# Patient Record
Sex: Female | Born: 1935 | Race: White | Hispanic: No | Smoking: Former smoker
Health system: Southern US, Community
[De-identification: ages and names within clinical notes are randomized; demographics above are authoritative.]

## PROBLEM LIST (undated history)

## (undated) DIAGNOSIS — I350 Nonrheumatic aortic (valve) stenosis: Secondary | ICD-10-CM

## (undated) DIAGNOSIS — K219 Gastro-esophageal reflux disease without esophagitis: Secondary | ICD-10-CM

## (undated) DIAGNOSIS — E7800 Pure hypercholesterolemia, unspecified: Secondary | ICD-10-CM

## (undated) DIAGNOSIS — C801 Malignant (primary) neoplasm, unspecified: Secondary | ICD-10-CM

## (undated) DIAGNOSIS — I10 Essential (primary) hypertension: Secondary | ICD-10-CM

## (undated) DIAGNOSIS — I509 Heart failure, unspecified: Secondary | ICD-10-CM

## (undated) DIAGNOSIS — I4891 Unspecified atrial fibrillation: Secondary | ICD-10-CM

## (undated) HISTORY — PX: CARDIAC CATHETERIZATION: SHX172

## (undated) HISTORY — PX: ABDOMINAL HYSTERECTOMY: SHX81

## (undated) HISTORY — PX: BREAST SURGERY: SHX581

---

## 2003-04-30 ENCOUNTER — Other Ambulatory Visit: Payer: Self-pay

## 2004-03-13 ENCOUNTER — Emergency Department: Payer: Self-pay | Admitting: Emergency Medicine

## 2004-03-13 ENCOUNTER — Other Ambulatory Visit: Payer: Self-pay

## 2004-04-25 ENCOUNTER — Ambulatory Visit: Payer: Self-pay | Admitting: Oncology

## 2004-06-13 ENCOUNTER — Emergency Department: Payer: Self-pay | Admitting: Emergency Medicine

## 2004-08-17 ENCOUNTER — Ambulatory Visit: Payer: Self-pay | Admitting: Internal Medicine

## 2004-09-02 ENCOUNTER — Emergency Department: Payer: Self-pay | Admitting: Emergency Medicine

## 2005-12-31 ENCOUNTER — Ambulatory Visit: Payer: Self-pay | Admitting: Internal Medicine

## 2006-04-16 ENCOUNTER — Ambulatory Visit: Payer: Self-pay | Admitting: Unknown Physician Specialty

## 2007-01-09 ENCOUNTER — Ambulatory Visit: Payer: Self-pay | Admitting: Internal Medicine

## 2007-05-19 ENCOUNTER — Ambulatory Visit: Payer: Self-pay | Admitting: Ophthalmology

## 2007-05-19 ENCOUNTER — Other Ambulatory Visit: Payer: Self-pay

## 2007-05-28 ENCOUNTER — Ambulatory Visit: Payer: Self-pay | Admitting: Ophthalmology

## 2008-01-12 ENCOUNTER — Ambulatory Visit: Payer: Self-pay | Admitting: Internal Medicine

## 2009-01-24 ENCOUNTER — Inpatient Hospital Stay: Payer: Self-pay | Admitting: Internal Medicine

## 2009-04-19 ENCOUNTER — Ambulatory Visit: Payer: Self-pay | Admitting: Internal Medicine

## 2009-10-06 ENCOUNTER — Inpatient Hospital Stay: Payer: Self-pay | Admitting: Internal Medicine

## 2009-10-12 ENCOUNTER — Inpatient Hospital Stay: Payer: Self-pay | Admitting: Internal Medicine

## 2010-03-14 ENCOUNTER — Ambulatory Visit: Payer: Self-pay | Admitting: *Deleted

## 2010-03-30 ENCOUNTER — Observation Stay: Payer: Self-pay | Admitting: Family Medicine

## 2010-05-09 ENCOUNTER — Inpatient Hospital Stay: Payer: Self-pay | Admitting: Internal Medicine

## 2010-05-31 ENCOUNTER — Ambulatory Visit: Payer: Self-pay | Admitting: Internal Medicine

## 2010-06-15 NOTE — Assessment & Plan Note (Signed)
Summary: FLU SHOT/EVM  Nurse Visit   Immunizations Administered:  Influenza Vaccine:    Vaccine Type: FLULAVAL    Site: left deltoid    Mfr: GlaxoSmithKline    Dose: 0.5 ml    Route: IM    Given by: Levonne Spiller EMT-P    Exp. Date: 11/11/2010    Lot #: [id number]    VIS given: 12/06/09 version given March 14, 2010.   Immunizations Administered:  Influenza Vaccine:    Vaccine Type: FLULAVAL    Site: left deltoid    Mfr: GlaxoSmithKline    Dose: 0.5 ml    Route: IM    Given by: Levonne Spiller EMT-P    Exp. Date: 11/11/2010    Lot #: [id number]    VIS given: 12/06/09 version given March 14, 2010.  Flu Vaccine Consent Questions:    Do you have a history of severe allergic reactions to this vaccine? no    Any prior history of allergic reactions to egg and/or gelatin? no    Do you have a sensitivity to the preservative Thimersol? no    Do you have a past history of Guillan-Barre Syndrome? no    Do you currently have an acute febrile illness? no    Have you ever had a severe reaction to latex? no    Vaccine information given and explained to patient? yes    Are you currently pregnant? no

## 2010-09-24 ENCOUNTER — Inpatient Hospital Stay: Payer: Self-pay | Admitting: Internal Medicine

## 2011-12-14 LAB — CBC
HGB: 15.5 g/dL (ref 12.0–16.0)
MCH: 31.4 pg (ref 26.0–34.0)
MCV: 93 fL (ref 80–100)
Platelet: 182 10*3/uL (ref 150–440)
RBC: 4.94 10*6/uL (ref 3.80–5.20)
WBC: 5.9 10*3/uL (ref 3.6–11.0)

## 2011-12-14 LAB — BASIC METABOLIC PANEL
BUN: 13 mg/dL (ref 7–18)
Calcium, Total: 9.6 mg/dL (ref 8.5–10.1)
Chloride: 102 mmol/L (ref 98–107)
Co2: 27 mmol/L (ref 21–32)
Potassium: 4 mmol/L (ref 3.5–5.1)
Sodium: 138 mmol/L (ref 136–145)

## 2011-12-14 LAB — PROTIME-INR
INR: 2.2
Prothrombin Time: 24.9 secs — ABNORMAL HIGH (ref 11.5–14.7)

## 2011-12-15 ENCOUNTER — Observation Stay: Payer: Self-pay | Admitting: Internal Medicine

## 2011-12-15 LAB — BASIC METABOLIC PANEL
Calcium, Total: 8.8 mg/dL (ref 8.5–10.1)
Chloride: 105 mmol/L (ref 98–107)
Co2: 30 mmol/L (ref 21–32)
Creatinine: 0.95 mg/dL (ref 0.60–1.30)
EGFR (Non-African Amer.): 53 — ABNORMAL LOW
Glucose: 91 mg/dL (ref 65–99)
Osmolality: 282 (ref 275–301)
Potassium: 3.8 mmol/L (ref 3.5–5.1)
Sodium: 142 mmol/L (ref 136–145)

## 2011-12-15 LAB — CK-MB
CK-MB: 1.6 ng/mL (ref 0.5–3.6)
CK-MB: 1.9 ng/mL (ref 0.5–3.6)

## 2011-12-15 LAB — CBC WITH DIFFERENTIAL/PLATELET
Basophil #: 0.1 10*3/uL (ref 0.0–0.1)
Basophil %: 1.5 %
Eosinophil #: 0.2 10*3/uL (ref 0.0–0.7)
HCT: 42.3 % (ref 35.0–47.0)
HGB: 14.7 g/dL (ref 12.0–16.0)
Lymphocyte #: 1.2 10*3/uL (ref 1.0–3.6)
MCHC: 34.8 g/dL (ref 32.0–36.0)
MCV: 92 fL (ref 80–100)
Monocyte %: 12.9 %
Neutrophil #: 3.6 10*3/uL (ref 1.4–6.5)
Neutrophil %: 61.2 %
RBC: 4.6 10*6/uL (ref 3.80–5.20)
RDW: 14.4 % (ref 11.5–14.5)
WBC: 5.8 10*3/uL (ref 3.6–11.0)

## 2011-12-15 LAB — LIPID PANEL
HDL Cholesterol: 72 mg/dL — ABNORMAL HIGH (ref 40–60)
Ldl Cholesterol, Calc: 75 mg/dL (ref 0–100)
Triglycerides: 65 mg/dL (ref 0–200)
VLDL Cholesterol, Calc: 13 mg/dL (ref 5–40)

## 2011-12-15 LAB — HEMOGLOBIN A1C: Hemoglobin A1C: 5.9 % (ref 4.2–6.3)

## 2011-12-15 LAB — PROTIME-INR
INR: 2.3
Prothrombin Time: 25.7 secs — ABNORMAL HIGH (ref 11.5–14.7)

## 2011-12-15 LAB — TROPONIN I
Troponin-I: 0.02 ng/mL
Troponin-I: <0.02 ng/mL

## 2012-08-25 ENCOUNTER — Emergency Department: Payer: Self-pay | Admitting: Emergency Medicine

## 2012-08-25 LAB — CBC
HCT: 39.6 % (ref 35.0–47.0)
HGB: 13.2 g/dL (ref 12.0–16.0)
MCH: 30.4 pg (ref 26.0–34.0)
MCHC: 33.4 g/dL (ref 32.0–36.0)
Platelet: 156 10*3/uL (ref 150–440)
RBC: 4.35 10*6/uL (ref 3.80–5.20)
RDW: 14.8 % — ABNORMAL HIGH (ref 11.5–14.5)

## 2012-08-25 LAB — TROPONIN I: Troponin-I: <0.02 ng/mL

## 2012-08-25 LAB — BASIC METABOLIC PANEL
Anion Gap: 6 — ABNORMAL LOW (ref 7–16)
Calcium, Total: 8.8 mg/dL (ref 8.5–10.1)
Co2: 27 mmol/L (ref 21–32)
Creatinine: 0.71 mg/dL (ref 0.60–1.30)
EGFR (African American): >60
EGFR (Non-African Amer.): >60
Potassium: 3.7 mmol/L (ref 3.5–5.1)

## 2012-08-25 LAB — CK TOTAL AND CKMB (NOT AT ARMC): CK-MB: 1.7 ng/mL (ref 0.5–3.6)

## 2012-08-26 LAB — LIPASE, BLOOD: Lipase: 363 U/L (ref 73–393)

## 2012-08-31 ENCOUNTER — Emergency Department: Payer: Self-pay | Admitting: Unknown Physician Specialty

## 2012-08-31 LAB — BASIC METABOLIC PANEL
BUN: 11 mg/dL (ref 7–18)
Glucose: 114 mg/dL — ABNORMAL HIGH (ref 65–99)
Osmolality: 272 (ref 275–301)
Potassium: 3.7 mmol/L (ref 3.5–5.1)
Sodium: 136 mmol/L (ref 136–145)

## 2012-08-31 LAB — CBC
HGB: 13.6 g/dL (ref 12.0–16.0)
MCHC: 34.1 g/dL (ref 32.0–36.0)
Platelet: 172 10*3/uL (ref 150–440)
RBC: 4.46 10*6/uL (ref 3.80–5.20)
RDW: 15.2 % — ABNORMAL HIGH (ref 11.5–14.5)
WBC: 5.6 10*3/uL (ref 3.6–11.0)

## 2012-08-31 LAB — URINALYSIS, COMPLETE
Bilirubin,UR: NEGATIVE
Blood: NEGATIVE
Nitrite: NEGATIVE
Protein: NEGATIVE
Squamous Epithelial: <1

## 2012-08-31 LAB — CK TOTAL AND CKMB (NOT AT ARMC): CK-MB: 1.7 ng/mL (ref 0.5–3.6)

## 2012-08-31 LAB — TROPONIN I: Troponin-I: <0.02 ng/mL

## 2012-08-31 LAB — PROTIME-INR: Prothrombin Time: 23.5 secs — ABNORMAL HIGH (ref 11.5–14.7)

## 2013-02-19 ENCOUNTER — Inpatient Hospital Stay: Payer: Self-pay | Admitting: Internal Medicine

## 2013-02-19 LAB — CBC WITH DIFFERENTIAL/PLATELET
Basophil #: 0 10*3/uL (ref 0.0–0.1)
Eosinophil #: 0.1 10*3/uL (ref 0.0–0.7)
Eosinophil %: 2.1 %
HCT: 30.5 % — ABNORMAL LOW (ref 35.0–47.0)
HGB: 10.4 g/dL — ABNORMAL LOW (ref 12.0–16.0)
MCH: 31 pg (ref 26.0–34.0)
MCHC: 34.2 g/dL (ref 32.0–36.0)
MCV: 91 fL (ref 80–100)
Monocyte #: 0.5 x10 3/mm (ref 0.2–0.9)
Monocyte %: 12.1 %
Neutrophil #: 2.9 10*3/uL (ref 1.4–6.5)
Neutrophil %: 67.6 %
Platelet: 152 10*3/uL (ref 150–440)
RBC: 3.37 10*6/uL — ABNORMAL LOW (ref 3.80–5.20)
RDW: 15.5 % — ABNORMAL HIGH (ref 11.5–14.5)

## 2013-02-19 LAB — COMPREHENSIVE METABOLIC PANEL
Albumin: 3.4 g/dL (ref 3.4–5.0)
Alkaline Phosphatase: 69 U/L (ref 50–136)
Anion Gap: 6 — ABNORMAL LOW (ref 7–16)
Calcium, Total: 8.6 mg/dL (ref 8.5–10.1)
Co2: 25 mmol/L (ref 21–32)
EGFR (African American): >60
Osmolality: 277 (ref 275–301)
SGOT(AST): 25 U/L (ref 15–37)
Sodium: 138 mmol/L (ref 136–145)

## 2013-02-19 LAB — PROTIME-INR: Prothrombin Time: 17.1 secs — ABNORMAL HIGH (ref 11.5–14.7)

## 2013-02-20 DIAGNOSIS — I359 Nonrheumatic aortic valve disorder, unspecified: Secondary | ICD-10-CM

## 2013-02-20 LAB — BASIC METABOLIC PANEL
Anion Gap: 7 (ref 7–16)
Calcium, Total: 8.3 mg/dL — ABNORMAL LOW (ref 8.5–10.1)
Creatinine: 0.82 mg/dL (ref 0.60–1.30)
EGFR (Non-African Amer.): >60
Glucose: 86 mg/dL (ref 65–99)
Sodium: 141 mmol/L (ref 136–145)

## 2013-02-20 LAB — CBC WITH DIFFERENTIAL/PLATELET
Eosinophil #: 0.1 10*3/uL (ref 0.0–0.7)
HGB: 10.3 g/dL — ABNORMAL LOW (ref 12.0–16.0)
Lymphocyte #: 0.7 10*3/uL — ABNORMAL LOW (ref 1.0–3.6)
Lymphocyte %: 15.8 %
MCH: 31.4 pg (ref 26.0–34.0)
MCHC: 34.6 g/dL (ref 32.0–36.0)
Monocyte #: 0.6 x10 3/mm (ref 0.2–0.9)
Neutrophil %: 67.2 %
RDW: 15.2 % — ABNORMAL HIGH (ref 11.5–14.5)

## 2013-02-21 LAB — CBC WITH DIFFERENTIAL/PLATELET
Basophil #: 0 10*3/uL (ref 0.0–0.1)
Basophil %: 0.8 %
Eosinophil #: 0.1 10*3/uL (ref 0.0–0.7)
Eosinophil %: 2.4 %
HCT: 33 % — ABNORMAL LOW (ref 35.0–47.0)
HGB: 11.5 g/dL — ABNORMAL LOW (ref 12.0–16.0)
Lymphocyte #: 0.9 10*3/uL — ABNORMAL LOW (ref 1.0–3.6)
Lymphocyte %: 16.2 %
MCHC: 35 g/dL (ref 32.0–36.0)
MCV: 90 fL (ref 80–100)
Monocyte #: 0.7 x10 3/mm (ref 0.2–0.9)
Neutrophil #: 3.6 10*3/uL (ref 1.4–6.5)
Neutrophil %: 67.4 %
Platelet: 151 10*3/uL (ref 150–440)
RBC: 3.68 10*6/uL — ABNORMAL LOW (ref 3.80–5.20)
RDW: 15.2 % — ABNORMAL HIGH (ref 11.5–14.5)
WBC: 5.4 10*3/uL (ref 3.6–11.0)

## 2013-02-21 LAB — BASIC METABOLIC PANEL WITH GFR
Anion Gap: 6 — ABNORMAL LOW
BUN: 7 mg/dL
Calcium, Total: 8.7 mg/dL
Chloride: 108 mmol/L — ABNORMAL HIGH
Co2: 24 mmol/L
Creatinine: 0.82 mg/dL
EGFR (African American): >60
EGFR (Non-African Amer.): >60
Glucose: 97 mg/dL
Osmolality: 274
Potassium: 3.5 mmol/L
Sodium: 138 mmol/L

## 2013-02-22 LAB — CBC WITH DIFFERENTIAL/PLATELET
Basophil #: 0 10*3/uL (ref 0.0–0.1)
HCT: 31.8 % — ABNORMAL LOW (ref 35.0–47.0)
HGB: 11.1 g/dL — ABNORMAL LOW (ref 12.0–16.0)
Lymphocyte #: 0.7 10*3/uL — ABNORMAL LOW (ref 1.0–3.6)
Lymphocyte %: 15.4 %
MCH: 31.4 pg (ref 26.0–34.0)
MCHC: 34.9 g/dL (ref 32.0–36.0)
MCV: 90 fL (ref 80–100)
Monocyte #: 0.7 x10 3/mm (ref 0.2–0.9)
Monocyte %: 14.8 %
Platelet: 154 10*3/uL (ref 150–440)
RDW: 15.2 % — ABNORMAL HIGH (ref 11.5–14.5)
WBC: 4.5 10*3/uL (ref 3.6–11.0)

## 2013-02-22 LAB — BASIC METABOLIC PANEL
BUN: 12 mg/dL (ref 7–18)
Calcium, Total: 8.7 mg/dL (ref 8.5–10.1)
Co2: 25 mmol/L (ref 21–32)
Creatinine: 0.84 mg/dL (ref 0.60–1.30)
EGFR (African American): >60
EGFR (Non-African Amer.): >60
Sodium: 137 mmol/L (ref 136–145)

## 2013-02-22 LAB — PROTIME-INR
INR: 1.2
Prothrombin Time: 15.4 secs — ABNORMAL HIGH (ref 11.5–14.7)

## 2013-02-24 ENCOUNTER — Inpatient Hospital Stay: Payer: Self-pay | Admitting: Internal Medicine

## 2013-02-24 LAB — COMPREHENSIVE METABOLIC PANEL
Alkaline Phosphatase: 88 U/L (ref 50–136)
Anion Gap: 6 — ABNORMAL LOW (ref 7–16)
BUN: 17 mg/dL (ref 7–18)
Calcium, Total: 8.8 mg/dL (ref 8.5–10.1)
Chloride: 104 mmol/L (ref 98–107)
Creatinine: 0.98 mg/dL (ref 0.60–1.30)
EGFR (Non-African Amer.): 51 — ABNORMAL LOW
Glucose: 99 mg/dL (ref 65–99)
Potassium: 4.3 mmol/L (ref 3.5–5.1)
SGOT(AST): 30 U/L (ref 15–37)
SGPT (ALT): 22 U/L (ref 12–78)
Sodium: 136 mmol/L (ref 136–145)
Total Protein: 7.4 g/dL (ref 6.4–8.2)

## 2013-02-24 LAB — URINALYSIS, COMPLETE
Bacteria: NONE SEEN
Bilirubin,UR: NEGATIVE
Glucose,UR: NEGATIVE mg/dL (ref 0–75)
Ketone: NEGATIVE
Ph: 7 (ref 4.5–8.0)
Protein: NEGATIVE
RBC,UR: 4 /HPF (ref 0–5)
Specific Gravity: 1.01 (ref 1.003–1.030)
Squamous Epithelial: 1
WBC UR: 3 /HPF (ref 0–5)

## 2013-02-24 LAB — CBC
HCT: 33.5 % — ABNORMAL LOW (ref 35.0–47.0)
HGB: 11.7 g/dL — ABNORMAL LOW (ref 12.0–16.0)
MCH: 31.5 pg (ref 26.0–34.0)
MCHC: 34.9 g/dL (ref 32.0–36.0)
MCV: 90 fL (ref 80–100)
Platelet: 201 10*3/uL (ref 150–440)
RBC: 3.72 10*6/uL — ABNORMAL LOW (ref 3.80–5.20)
RDW: 15.6 % — ABNORMAL HIGH (ref 11.5–14.5)

## 2013-02-24 LAB — TROPONIN I: Troponin-I: <0.02 ng/mL

## 2013-02-24 LAB — CK: CK, Total: 110 U/L (ref 21–215)

## 2013-02-24 LAB — STOOL CULTURE

## 2013-02-25 LAB — CBC WITH DIFFERENTIAL/PLATELET
Basophil %: 1.2 %
Eosinophil #: 0.2 10*3/uL (ref 0.0–0.7)
Eosinophil %: 4.2 %
HGB: 10.8 g/dL — ABNORMAL LOW (ref 12.0–16.0)
Lymphocyte #: 0.7 10*3/uL — ABNORMAL LOW (ref 1.0–3.6)
Lymphocyte %: 15 %
MCH: 30.8 pg (ref 26.0–34.0)
MCV: 91 fL (ref 80–100)
Monocyte #: 0.7 x10 3/mm (ref 0.2–0.9)
Monocyte %: 13.9 %
Neutrophil #: 3.2 10*3/uL (ref 1.4–6.5)
Platelet: 174 10*3/uL (ref 150–440)
RDW: 15.2 % — ABNORMAL HIGH (ref 11.5–14.5)
WBC: 4.8 10*3/uL (ref 3.6–11.0)

## 2013-02-25 LAB — COMPREHENSIVE METABOLIC PANEL
Albumin: 2.9 g/dL — ABNORMAL LOW (ref 3.4–5.0)
Alkaline Phosphatase: 70 U/L (ref 50–136)
Anion Gap: 5 — ABNORMAL LOW (ref 7–16)
BUN: 11 mg/dL (ref 7–18)
Calcium, Total: 8.5 mg/dL (ref 8.5–10.1)
Chloride: 106 mmol/L (ref 98–107)
Co2: 27 mmol/L (ref 21–32)
Creatinine: 0.86 mg/dL (ref 0.60–1.30)
EGFR (Non-African Amer.): 59 — ABNORMAL LOW
Osmolality: 275 (ref 275–301)
SGOT(AST): 16 U/L (ref 15–37)
Sodium: 138 mmol/L (ref 136–145)
Total Protein: 6.3 g/dL — ABNORMAL LOW (ref 6.4–8.2)

## 2013-02-25 LAB — LIPID PANEL
Cholesterol: 134 mg/dL (ref 0–200)
HDL Cholesterol: 58 mg/dL (ref 40–60)
Ldl Cholesterol, Calc: 56 mg/dL (ref 0–100)
Triglycerides: 99 mg/dL (ref 0–200)
VLDL Cholesterol, Calc: 20 mg/dL (ref 5–40)

## 2013-02-25 LAB — TROPONIN I: Troponin-I: <0.02 ng/mL

## 2013-02-26 LAB — BASIC METABOLIC PANEL
Anion Gap: 6 — ABNORMAL LOW (ref 7–16)
Calcium, Total: 8.8 mg/dL (ref 8.5–10.1)
EGFR (Non-African Amer.): 59 — ABNORMAL LOW
Glucose: 92 mg/dL (ref 65–99)
Osmolality: 271 (ref 275–301)
Potassium: 3.6 mmol/L (ref 3.5–5.1)

## 2013-02-26 LAB — CBC WITH DIFFERENTIAL/PLATELET
Basophil #: 0.1 10*3/uL (ref 0.0–0.1)
Basophil %: 1.2 %
Eosinophil #: 0.2 10*3/uL (ref 0.0–0.7)
Eosinophil %: 4 %
HCT: 32 % — ABNORMAL LOW (ref 35.0–47.0)
HGB: 11.2 g/dL — ABNORMAL LOW (ref 12.0–16.0)
Lymphocyte %: 15.5 %
MCH: 31.3 pg (ref 26.0–34.0)
MCV: 90 fL (ref 80–100)
Monocyte #: 0.6 x10 3/mm (ref 0.2–0.9)
Neutrophil #: 2.7 10*3/uL (ref 1.4–6.5)
Neutrophil %: 64.4 %
RBC: 3.57 10*6/uL — ABNORMAL LOW (ref 3.80–5.20)
WBC: 4.2 10*3/uL (ref 3.6–11.0)

## 2013-02-26 LAB — MAGNESIUM: Magnesium: 1.7 mg/dL — ABNORMAL LOW

## 2013-02-27 LAB — CBC WITH DIFFERENTIAL/PLATELET
Basophil #: 0.1 10*3/uL (ref 0.0–0.1)
Eosinophil #: 0.2 10*3/uL (ref 0.0–0.7)
Eosinophil %: 4.4 %
HGB: 11.1 g/dL — ABNORMAL LOW (ref 12.0–16.0)
Lymphocyte #: 0.7 10*3/uL — ABNORMAL LOW (ref 1.0–3.6)
Lymphocyte %: 14.6 %
MCH: 30.9 pg (ref 26.0–34.0)
MCHC: 34.5 g/dL (ref 32.0–36.0)
Monocyte #: 0.7 x10 3/mm (ref 0.2–0.9)
Neutrophil #: 3.2 10*3/uL (ref 1.4–6.5)
Platelet: 190 10*3/uL (ref 150–440)
RDW: 14.9 % — ABNORMAL HIGH (ref 11.5–14.5)
WBC: 5 10*3/uL (ref 3.6–11.0)

## 2013-02-27 LAB — BASIC METABOLIC PANEL
BUN: 10 mg/dL (ref 7–18)
Chloride: 105 mmol/L (ref 98–107)
Creatinine: 0.93 mg/dL (ref 0.60–1.30)
EGFR (Non-African Amer.): 54 — ABNORMAL LOW
Osmolality: 274 (ref 275–301)
Potassium: 3.7 mmol/L (ref 3.5–5.1)

## 2013-03-03 ENCOUNTER — Inpatient Hospital Stay: Payer: Self-pay | Admitting: Internal Medicine

## 2013-03-03 LAB — COMPREHENSIVE METABOLIC PANEL
BUN: 12 mg/dL (ref 7–18)
Calcium, Total: 8.8 mg/dL (ref 8.5–10.1)
Co2: 29 mmol/L (ref 21–32)
Creatinine: 0.96 mg/dL (ref 0.60–1.30)
EGFR (Non-African Amer.): 52 — ABNORMAL LOW
Glucose: 100 mg/dL — ABNORMAL HIGH (ref 65–99)
Potassium: 4.2 mmol/L (ref 3.5–5.1)
SGOT(AST): 33 U/L (ref 15–37)
Total Protein: 7.5 g/dL (ref 6.4–8.2)

## 2013-03-03 LAB — URINALYSIS, COMPLETE
Bilirubin,UR: NEGATIVE
Glucose,UR: NEGATIVE mg/dL (ref 0–75)
Ph: 7 (ref 4.5–8.0)
Protein: NEGATIVE
Specific Gravity: 1.014 (ref 1.003–1.030)
Squamous Epithelial: NONE SEEN
WBC UR: <1 /HPF (ref 0–5)

## 2013-03-03 LAB — CBC
HGB: 11.2 g/dL — ABNORMAL LOW (ref 12.0–16.0)
MCHC: 34.3 g/dL (ref 32.0–36.0)
MCV: 89 fL (ref 80–100)
RBC: 3.65 10*6/uL — ABNORMAL LOW (ref 3.80–5.20)
RDW: 15.2 % — ABNORMAL HIGH (ref 11.5–14.5)
WBC: 4.8 10*3/uL (ref 3.6–11.0)

## 2013-03-03 LAB — HEMOGLOBIN: HGB: 9.6 g/dL — ABNORMAL LOW (ref 12.0–16.0)

## 2013-03-03 LAB — TROPONIN I: Troponin-I: <0.02 ng/mL

## 2013-03-03 LAB — OCCULT BLOOD X 1 CARD TO LAB, STOOL: Occult Blood, Feces: POSITIVE

## 2013-03-04 LAB — BASIC METABOLIC PANEL
Anion Gap: 6 — ABNORMAL LOW (ref 7–16)
Calcium, Total: 8.4 mg/dL — ABNORMAL LOW (ref 8.5–10.1)
Co2: 26 mmol/L (ref 21–32)
Creatinine: 0.94 mg/dL (ref 0.60–1.30)
EGFR (Non-African Amer.): 53 — ABNORMAL LOW
Glucose: 96 mg/dL (ref 65–99)
Potassium: 3.8 mmol/L (ref 3.5–5.1)
Sodium: 138 mmol/L (ref 136–145)

## 2013-03-04 LAB — CBC WITH DIFFERENTIAL/PLATELET
Basophil #: 0.1 10*3/uL (ref 0.0–0.1)
Eosinophil #: 0.1 10*3/uL (ref 0.0–0.7)
HCT: 28 % — ABNORMAL LOW (ref 35.0–47.0)
HGB: 9.6 g/dL — ABNORMAL LOW (ref 12.0–16.0)
Lymphocyte #: 0.8 10*3/uL — ABNORMAL LOW (ref 1.0–3.6)
MCH: 30.6 pg (ref 26.0–34.0)
MCHC: 34.3 g/dL (ref 32.0–36.0)
MCV: 89 fL (ref 80–100)
Monocyte #: 0.5 x10 3/mm (ref 0.2–0.9)
Monocyte %: 12.2 %
Platelet: 179 10*3/uL (ref 150–440)
RDW: 14.8 % — ABNORMAL HIGH (ref 11.5–14.5)

## 2013-03-04 LAB — HEMOGLOBIN: HGB: 9.7 g/dL — ABNORMAL LOW (ref 12.0–16.0)

## 2013-03-05 LAB — CBC WITH DIFFERENTIAL/PLATELET
Basophil #: 0.1 10*3/uL (ref 0.0–0.1)
Eosinophil #: 0.1 10*3/uL (ref 0.0–0.7)
HCT: 29.1 % — ABNORMAL LOW (ref 35.0–47.0)
Lymphocyte %: 15 %
MCH: 30.4 pg (ref 26.0–34.0)
MCHC: 34.3 g/dL (ref 32.0–36.0)
MCV: 89 fL (ref 80–100)
Monocyte #: 0.4 x10 3/mm (ref 0.2–0.9)
Platelet: 209 10*3/uL (ref 150–440)
RDW: 14.7 % — ABNORMAL HIGH (ref 11.5–14.5)
WBC: 3.9 10*3/uL (ref 3.6–11.0)

## 2013-03-05 LAB — BASIC METABOLIC PANEL
BUN: 8 mg/dL (ref 7–18)
Calcium, Total: 8.6 mg/dL (ref 8.5–10.1)
Chloride: 107 mmol/L (ref 98–107)
Glucose: 131 mg/dL — ABNORMAL HIGH (ref 65–99)
Osmolality: 276 (ref 275–301)
Potassium: 3.6 mmol/L (ref 3.5–5.1)

## 2013-03-06 LAB — BASIC METABOLIC PANEL
BUN: 8 mg/dL (ref 7–18)
Calcium, Total: 8.4 mg/dL — ABNORMAL LOW (ref 8.5–10.1)
Chloride: 109 mmol/L — ABNORMAL HIGH (ref 98–107)
Creatinine: 0.95 mg/dL (ref 0.60–1.30)
EGFR (Non-African Amer.): 53 — ABNORMAL LOW
Osmolality: 275 (ref 275–301)
Potassium: 3.6 mmol/L (ref 3.5–5.1)
Sodium: 139 mmol/L (ref 136–145)

## 2013-03-06 LAB — CBC WITH DIFFERENTIAL/PLATELET
Basophil #: 0.1 10*3/uL (ref 0.0–0.1)
Basophil %: 1.5 %
HCT: 26.4 % — ABNORMAL LOW (ref 35.0–47.0)
HGB: 9.1 g/dL — ABNORMAL LOW (ref 12.0–16.0)
Lymphocyte %: 17.8 %
MCHC: 34.5 g/dL (ref 32.0–36.0)
Monocyte #: 0.6 x10 3/mm (ref 0.2–0.9)
Monocyte %: 14.3 %
Platelet: 171 10*3/uL (ref 150–440)
RDW: 14.5 % (ref 11.5–14.5)

## 2013-03-07 LAB — BASIC METABOLIC PANEL
Anion Gap: 6 — ABNORMAL LOW (ref 7–16)
BUN: 7 mg/dL (ref 7–18)
Calcium, Total: 8.7 mg/dL (ref 8.5–10.1)
Creatinine: 0.9 mg/dL (ref 0.60–1.30)
EGFR (African American): >60
EGFR (Non-African Amer.): 56 — ABNORMAL LOW
Glucose: 90 mg/dL (ref 65–99)
Osmolality: 275 (ref 275–301)
Sodium: 139 mmol/L (ref 136–145)

## 2013-03-07 LAB — CBC WITH DIFFERENTIAL/PLATELET
Basophil #: 0.1 10*3/uL (ref 0.0–0.1)
Basophil %: 1.5 %
Eosinophil #: 0.1 10*3/uL (ref 0.0–0.7)
Eosinophil %: 3.6 %
HCT: 25.4 % — ABNORMAL LOW (ref 35.0–47.0)
HGB: 8.9 g/dL — ABNORMAL LOW (ref 12.0–16.0)
MCHC: 34.9 g/dL (ref 32.0–36.0)
MCV: 88 fL (ref 80–100)
Monocyte %: 13.5 %
Platelet: 175 10*3/uL (ref 150–440)
RBC: 2.9 10*6/uL — ABNORMAL LOW (ref 3.80–5.20)
RDW: 14.5 % (ref 11.5–14.5)
WBC: 3.9 10*3/uL (ref 3.6–11.0)

## 2013-03-08 LAB — CBC WITH DIFFERENTIAL/PLATELET
Basophil #: 0 10*3/uL (ref 0.0–0.1)
Basophil %: 1 %
Eosinophil #: 0.2 10*3/uL (ref 0.0–0.7)
Eosinophil %: 3.5 %
HCT: 30 % — ABNORMAL LOW (ref 35.0–47.0)
HGB: 10.3 g/dL — ABNORMAL LOW (ref 12.0–16.0)
Lymphocyte %: 13.3 %
MCH: 29.8 pg (ref 26.0–34.0)
MCV: 87 fL (ref 80–100)
Monocyte #: 0.7 x10 3/mm (ref 0.2–0.9)
Neutrophil %: 68.3 %
Platelet: 182 10*3/uL (ref 150–440)
RBC: 3.45 10*6/uL — ABNORMAL LOW (ref 3.80–5.20)
RDW: 15 % — ABNORMAL HIGH (ref 11.5–14.5)

## 2013-03-08 LAB — BASIC METABOLIC PANEL
Anion Gap: 7 (ref 7–16)
BUN: 6 mg/dL — ABNORMAL LOW (ref 7–18)
EGFR (African American): 59 — ABNORMAL LOW
Osmolality: 273 (ref 275–301)
Potassium: 3.6 mmol/L (ref 3.5–5.1)
Sodium: 138 mmol/L (ref 136–145)

## 2013-04-27 ENCOUNTER — Emergency Department: Payer: Self-pay | Admitting: Emergency Medicine

## 2013-04-28 ENCOUNTER — Ambulatory Visit: Payer: Self-pay | Admitting: General Practice

## 2013-05-21 ENCOUNTER — Inpatient Hospital Stay: Payer: Self-pay | Admitting: Internal Medicine

## 2013-05-21 LAB — COMPREHENSIVE METABOLIC PANEL
ANION GAP: 5 — AB (ref 7–16)
AST: 17 U/L (ref 15–37)
Albumin: 3.5 g/dL (ref 3.4–5.0)
Alkaline Phosphatase: 112 U/L
BUN: 12 mg/dL (ref 7–18)
Bilirubin,Total: 0.8 mg/dL (ref 0.2–1.0)
CHLORIDE: 103 mmol/L (ref 98–107)
CO2: 29 mmol/L (ref 21–32)
Calcium, Total: 8.9 mg/dL (ref 8.5–10.1)
Creatinine: 0.8 mg/dL (ref 0.60–1.30)
EGFR (Non-African Amer.): >60
Glucose: 104 mg/dL — ABNORMAL HIGH (ref 65–99)
Osmolality: 274 (ref 275–301)
Potassium: 3.7 mmol/L (ref 3.5–5.1)
SGPT (ALT): 16 U/L (ref 12–78)
SODIUM: 137 mmol/L (ref 136–145)
Total Protein: 7.7 g/dL (ref 6.4–8.2)

## 2013-05-21 LAB — URINALYSIS, COMPLETE
BILIRUBIN, UR: NEGATIVE
BLOOD: NEGATIVE
Bacteria: NONE SEEN
Glucose,UR: NEGATIVE mg/dL (ref 0–75)
Ketone: NEGATIVE
Leukocyte Esterase: NEGATIVE
Nitrite: NEGATIVE
Ph: 7 (ref 4.5–8.0)
Protein: NEGATIVE
RBC,UR: <1 /HPF (ref 0–5)
Specific Gravity: 1.012 (ref 1.003–1.030)
Squamous Epithelial: <1
WBC UR: <1 /HPF (ref 0–5)

## 2013-05-21 LAB — CBC
HCT: 32.3 % — AB (ref 35.0–47.0)
HGB: 10.7 g/dL — AB (ref 12.0–16.0)
MCH: 25.7 pg — ABNORMAL LOW (ref 26.0–34.0)
MCHC: 33 g/dL (ref 32.0–36.0)
MCV: 78 fL — AB (ref 80–100)
Platelet: 227 10*3/uL (ref 150–440)
RBC: 4.14 10*6/uL (ref 3.80–5.20)
RDW: 19 % — ABNORMAL HIGH (ref 11.5–14.5)
WBC: 5.1 10*3/uL (ref 3.6–11.0)

## 2013-05-21 LAB — PROTIME-INR
INR: 1.2
Prothrombin Time: 14.6 secs (ref 11.5–14.7)

## 2013-05-21 LAB — TROPONIN I: Troponin-I: <0.02 ng/mL

## 2013-05-22 LAB — LIPID PANEL
Cholesterol: 100 mg/dL (ref 0–200)
HDL Cholesterol: 61 mg/dL — ABNORMAL HIGH (ref 40–60)
Ldl Cholesterol, Calc: 29 mg/dL (ref 0–100)
Triglycerides: 51 mg/dL (ref 0–200)
VLDL CHOLESTEROL, CALC: 10 mg/dL (ref 5–40)

## 2013-05-22 LAB — BASIC METABOLIC PANEL
Anion Gap: 8 (ref 7–16)
BUN: 11 mg/dL (ref 7–18)
CHLORIDE: 103 mmol/L (ref 98–107)
CO2: 25 mmol/L (ref 21–32)
Calcium, Total: 8.9 mg/dL (ref 8.5–10.1)
Creatinine: 0.87 mg/dL (ref 0.60–1.30)
EGFR (African American): >60
EGFR (Non-African Amer.): 59 — ABNORMAL LOW
Glucose: 88 mg/dL (ref 65–99)
Osmolality: 271 (ref 275–301)
Potassium: 3.3 mmol/L — ABNORMAL LOW (ref 3.5–5.1)
Sodium: 136 mmol/L (ref 136–145)

## 2013-05-22 LAB — CBC WITH DIFFERENTIAL/PLATELET
BASOS ABS: 0.1 10*3/uL (ref 0.0–0.1)
Basophil %: 2 %
EOS PCT: 6.1 %
Eosinophil #: 0.3 10*3/uL (ref 0.0–0.7)
HCT: 31.5 % — ABNORMAL LOW (ref 35.0–47.0)
HGB: 10.4 g/dL — ABNORMAL LOW (ref 12.0–16.0)
LYMPHS ABS: 0.8 10*3/uL — AB (ref 1.0–3.6)
Lymphocyte %: 16.7 %
MCH: 25.8 pg — AB (ref 26.0–34.0)
MCHC: 32.9 g/dL (ref 32.0–36.0)
MCV: 78 fL — ABNORMAL LOW (ref 80–100)
MONOS PCT: 13.5 %
Monocyte #: 0.6 x10 3/mm (ref 0.2–0.9)
NEUTROS PCT: 61.7 %
Neutrophil #: 2.9 10*3/uL (ref 1.4–6.5)
Platelet: 187 10*3/uL (ref 150–440)
RBC: 4.01 10*6/uL (ref 3.80–5.20)
RDW: 19.2 % — ABNORMAL HIGH (ref 11.5–14.5)
WBC: 4.7 10*3/uL (ref 3.6–11.0)

## 2013-05-22 LAB — MAGNESIUM: MAGNESIUM: 1.7 mg/dL — AB

## 2013-05-23 LAB — CBC WITH DIFFERENTIAL/PLATELET
Basophil #: 0.1 10*3/uL (ref 0.0–0.1)
Basophil %: 2.1 %
EOS PCT: 6.7 %
Eosinophil #: 0.3 10*3/uL (ref 0.0–0.7)
HCT: 30.4 % — AB (ref 35.0–47.0)
HGB: 10 g/dL — ABNORMAL LOW (ref 12.0–16.0)
LYMPHS PCT: 16.6 %
Lymphocyte #: 0.7 10*3/uL — ABNORMAL LOW (ref 1.0–3.6)
MCH: 25.6 pg — ABNORMAL LOW (ref 26.0–34.0)
MCHC: 33 g/dL (ref 32.0–36.0)
MCV: 78 fL — ABNORMAL LOW (ref 80–100)
Monocyte #: 0.5 x10 3/mm (ref 0.2–0.9)
Monocyte %: 12.8 %
NEUTROS PCT: 61.8 %
Neutrophil #: 2.6 10*3/uL (ref 1.4–6.5)
PLATELETS: 183 10*3/uL (ref 150–440)
RBC: 3.92 10*6/uL (ref 3.80–5.20)
RDW: 18.9 % — ABNORMAL HIGH (ref 11.5–14.5)
WBC: 4.2 10*3/uL (ref 3.6–11.0)

## 2013-05-23 LAB — BASIC METABOLIC PANEL
ANION GAP: 7 (ref 7–16)
BUN: 10 mg/dL (ref 7–18)
CALCIUM: 8.7 mg/dL (ref 8.5–10.1)
CHLORIDE: 106 mmol/L (ref 98–107)
Co2: 25 mmol/L (ref 21–32)
Creatinine: 0.9 mg/dL (ref 0.60–1.30)
GFR CALC NON AF AMER: 56 — AB
GLUCOSE: 91 mg/dL (ref 65–99)
Osmolality: 274 (ref 275–301)
POTASSIUM: 3.6 mmol/L (ref 3.5–5.1)
Sodium: 138 mmol/L (ref 136–145)

## 2013-05-26 LAB — BASIC METABOLIC PANEL
ANION GAP: 6 — AB (ref 7–16)
BUN: 8 mg/dL (ref 7–18)
CO2: 25 mmol/L (ref 21–32)
Calcium, Total: 9 mg/dL (ref 8.5–10.1)
Chloride: 104 mmol/L (ref 98–107)
Creatinine: 0.98 mg/dL (ref 0.60–1.30)
EGFR (African American): 59 — ABNORMAL LOW
EGFR (Non-African Amer.): 51 — ABNORMAL LOW
Glucose: 84 mg/dL (ref 65–99)
OSMOLALITY: 268 (ref 275–301)
Potassium: 3.5 mmol/L (ref 3.5–5.1)
SODIUM: 135 mmol/L — AB (ref 136–145)

## 2013-05-26 LAB — CBC WITH DIFFERENTIAL/PLATELET
BASOS ABS: 0.1 10*3/uL (ref 0.0–0.1)
Basophil %: 1.8 %
Eosinophil #: 0.4 10*3/uL (ref 0.0–0.7)
Eosinophil %: 9.7 %
HCT: 28.1 % — AB (ref 35.0–47.0)
HGB: 9.1 g/dL — ABNORMAL LOW (ref 12.0–16.0)
LYMPHS ABS: 0.8 10*3/uL — AB (ref 1.0–3.6)
LYMPHS PCT: 21.6 %
MCH: 25.1 pg — ABNORMAL LOW (ref 26.0–34.0)
MCHC: 32.3 g/dL (ref 32.0–36.0)
MCV: 78 fL — AB (ref 80–100)
Monocyte #: 0.5 x10 3/mm (ref 0.2–0.9)
Monocyte %: 14.6 %
Neutrophil #: 1.9 10*3/uL (ref 1.4–6.5)
Neutrophil %: 52.3 %
Platelet: 164 10*3/uL (ref 150–440)
RBC: 3.62 10*6/uL — AB (ref 3.80–5.20)
RDW: 18.8 % — ABNORMAL HIGH (ref 11.5–14.5)
WBC: 3.6 10*3/uL (ref 3.6–11.0)

## 2013-05-31 ENCOUNTER — Inpatient Hospital Stay: Payer: Self-pay | Admitting: Internal Medicine

## 2013-05-31 LAB — CBC
HCT: 32.8 % — ABNORMAL LOW (ref 35.0–47.0)
HGB: 10.6 g/dL — ABNORMAL LOW (ref 12.0–16.0)
MCH: 25 pg — ABNORMAL LOW (ref 26.0–34.0)
MCHC: 32.3 g/dL (ref 32.0–36.0)
MCV: 77 fL — AB (ref 80–100)
PLATELETS: 181 10*3/uL (ref 150–440)
RBC: 4.23 10*6/uL (ref 3.80–5.20)
RDW: 19.3 % — AB (ref 11.5–14.5)
WBC: 5.3 10*3/uL (ref 3.6–11.0)

## 2013-05-31 LAB — BASIC METABOLIC PANEL
ANION GAP: 7 (ref 7–16)
BUN: 12 mg/dL (ref 7–18)
CO2: 25 mmol/L (ref 21–32)
CREATININE: 0.86 mg/dL (ref 0.60–1.30)
Calcium, Total: 9.2 mg/dL (ref 8.5–10.1)
Chloride: 102 mmol/L (ref 98–107)
GFR CALC NON AF AMER: 59 — AB
Glucose: 109 mg/dL — ABNORMAL HIGH (ref 65–99)
OSMOLALITY: 269 (ref 275–301)
POTASSIUM: 3.6 mmol/L (ref 3.5–5.1)
SODIUM: 134 mmol/L — AB (ref 136–145)

## 2013-05-31 LAB — TROPONIN I
TROPONIN-I: 0.03 ng/mL
TROPONIN-I: 0.02 ng/mL
Troponin-I: <0.02 ng/mL

## 2013-05-31 LAB — CK
CK, TOTAL: 180 U/L (ref 21–215)
CK, Total: 240 U/L — ABNORMAL HIGH (ref 21–215)

## 2013-05-31 LAB — PRO B NATRIURETIC PEPTIDE: B-Type Natriuretic Peptide: 1554 pg/mL — ABNORMAL HIGH (ref 0–450)

## 2013-06-01 LAB — CBC WITH DIFFERENTIAL/PLATELET
BASOS PCT: 1.2 %
Basophil #: 0.1 10*3/uL (ref 0.0–0.1)
Eosinophil #: 0.3 10*3/uL (ref 0.0–0.7)
Eosinophil %: 5.1 %
HCT: 28.9 % — AB (ref 35.0–47.0)
HGB: 9.3 g/dL — ABNORMAL LOW (ref 12.0–16.0)
LYMPHS ABS: 0.5 10*3/uL — AB (ref 1.0–3.6)
LYMPHS PCT: 9.6 %
MCH: 25.5 pg — ABNORMAL LOW (ref 26.0–34.0)
MCHC: 32.3 g/dL (ref 32.0–36.0)
MCV: 79 fL — ABNORMAL LOW (ref 80–100)
MONO ABS: 0.6 x10 3/mm (ref 0.2–0.9)
MONOS PCT: 12.2 %
NEUTROS PCT: 71.9 %
Neutrophil #: 3.6 10*3/uL (ref 1.4–6.5)
Platelet: 159 10*3/uL (ref 150–440)
RBC: 3.67 10*6/uL — AB (ref 3.80–5.20)
RDW: 19.7 % — ABNORMAL HIGH (ref 11.5–14.5)
WBC: 5 10*3/uL (ref 3.6–11.0)

## 2013-06-01 LAB — CK: CK, TOTAL: 233 U/L — AB (ref 21–215)

## 2013-06-01 LAB — BASIC METABOLIC PANEL
Anion Gap: 5 — ABNORMAL LOW (ref 7–16)
BUN: 12 mg/dL (ref 7–18)
CALCIUM: 8.7 mg/dL (ref 8.5–10.1)
CREATININE: 0.76 mg/dL (ref 0.60–1.30)
Chloride: 101 mmol/L (ref 98–107)
Co2: 27 mmol/L (ref 21–32)
EGFR (African American): >60
EGFR (Non-African Amer.): >60
Glucose: 100 mg/dL — ABNORMAL HIGH (ref 65–99)
OSMOLALITY: 266 (ref 275–301)
Potassium: 3.4 mmol/L — ABNORMAL LOW (ref 3.5–5.1)
Sodium: 133 mmol/L — ABNORMAL LOW (ref 136–145)

## 2013-06-02 LAB — CBC WITH DIFFERENTIAL/PLATELET
BASOS ABS: 0.1 10*3/uL (ref 0.0–0.1)
BASOS PCT: 1.1 %
EOS ABS: 0.3 10*3/uL (ref 0.0–0.7)
EOS PCT: 6.2 %
HCT: 27.9 % — AB (ref 35.0–47.0)
HGB: 8.9 g/dL — ABNORMAL LOW (ref 12.0–16.0)
LYMPHS PCT: 11.7 %
Lymphocyte #: 0.6 10*3/uL — ABNORMAL LOW (ref 1.0–3.6)
MCH: 25.1 pg — AB (ref 26.0–34.0)
MCHC: 31.9 g/dL — ABNORMAL LOW (ref 32.0–36.0)
MCV: 79 fL — ABNORMAL LOW (ref 80–100)
MONO ABS: 0.7 x10 3/mm (ref 0.2–0.9)
Monocyte %: 14.2 %
Neutrophil #: 3.3 10*3/uL (ref 1.4–6.5)
Neutrophil %: 66.8 %
Platelet: 167 10*3/uL (ref 150–440)
RBC: 3.55 10*6/uL — ABNORMAL LOW (ref 3.80–5.20)
RDW: 19.7 % — ABNORMAL HIGH (ref 11.5–14.5)
WBC: 4.9 10*3/uL (ref 3.6–11.0)

## 2013-06-02 LAB — BASIC METABOLIC PANEL
Anion Gap: 4 — ABNORMAL LOW (ref 7–16)
BUN: 13 mg/dL (ref 7–18)
CALCIUM: 9.1 mg/dL (ref 8.5–10.1)
CREATININE: 0.85 mg/dL (ref 0.60–1.30)
Chloride: 101 mmol/L (ref 98–107)
Co2: 28 mmol/L (ref 21–32)
EGFR (African American): >60
EGFR (Non-African Amer.): >60
Glucose: 87 mg/dL (ref 65–99)
Osmolality: 266 (ref 275–301)
Potassium: 3.7 mmol/L (ref 3.5–5.1)
Sodium: 133 mmol/L — ABNORMAL LOW (ref 136–145)

## 2013-06-03 LAB — BASIC METABOLIC PANEL
ANION GAP: 3 — AB (ref 7–16)
BUN: 13 mg/dL (ref 7–18)
CHLORIDE: 100 mmol/L (ref 98–107)
CREATININE: 0.78 mg/dL (ref 0.60–1.30)
Calcium, Total: 9.2 mg/dL (ref 8.5–10.1)
Co2: 30 mmol/L (ref 21–32)
EGFR (African American): >60
EGFR (Non-African Amer.): >60
GLUCOSE: 89 mg/dL (ref 65–99)
OSMOLALITY: 266 (ref 275–301)
POTASSIUM: 3.7 mmol/L (ref 3.5–5.1)
Sodium: 133 mmol/L — ABNORMAL LOW (ref 136–145)

## 2013-06-03 LAB — CBC WITH DIFFERENTIAL/PLATELET
BASOS PCT: 1.2 %
Basophil #: 0.1 10*3/uL (ref 0.0–0.1)
Eosinophil #: 0.3 10*3/uL (ref 0.0–0.7)
Eosinophil %: 6.3 %
HCT: 28.7 % — AB (ref 35.0–47.0)
HGB: 9.2 g/dL — ABNORMAL LOW (ref 12.0–16.0)
LYMPHS ABS: 0.6 10*3/uL — AB (ref 1.0–3.6)
Lymphocyte %: 13 %
MCH: 25.2 pg — ABNORMAL LOW (ref 26.0–34.0)
MCHC: 32 g/dL (ref 32.0–36.0)
MCV: 79 fL — AB (ref 80–100)
Monocyte #: 0.6 x10 3/mm (ref 0.2–0.9)
Monocyte %: 14.2 %
NEUTROS ABS: 2.9 10*3/uL (ref 1.4–6.5)
NEUTROS PCT: 65.3 %
Platelet: 179 10*3/uL (ref 150–440)
RBC: 3.64 10*6/uL — ABNORMAL LOW (ref 3.80–5.20)
RDW: 19.4 % — ABNORMAL HIGH (ref 11.5–14.5)
WBC: 4.4 10*3/uL (ref 3.6–11.0)

## 2013-06-03 LAB — MAGNESIUM: Magnesium: 1.6 mg/dL — ABNORMAL LOW

## 2013-11-17 ENCOUNTER — Observation Stay: Payer: Self-pay | Admitting: Internal Medicine

## 2013-11-17 LAB — CBC
HCT: 39.9 % (ref 35.0–47.0)
HGB: 13.2 g/dL (ref 12.0–16.0)
MCH: 30.1 pg (ref 26.0–34.0)
MCHC: 33.1 g/dL (ref 32.0–36.0)
MCV: 91 fL (ref 80–100)
Platelet: 162 10*3/uL (ref 150–440)
RBC: 4.38 10*6/uL (ref 3.80–5.20)
RDW: 15.4 % — ABNORMAL HIGH (ref 11.5–14.5)
WBC: 6.8 10*3/uL (ref 3.6–11.0)

## 2013-11-17 LAB — BASIC METABOLIC PANEL
ANION GAP: 4 — AB (ref 7–16)
BUN: 15 mg/dL (ref 7–18)
CHLORIDE: 102 mmol/L (ref 98–107)
Calcium, Total: 9.2 mg/dL (ref 8.5–10.1)
Co2: 30 mmol/L (ref 21–32)
Creatinine: 0.93 mg/dL (ref 0.60–1.30)
EGFR (African American): >60
EGFR (Non-African Amer.): 54 — ABNORMAL LOW
GLUCOSE: 105 mg/dL — AB (ref 65–99)
OSMOLALITY: 273 (ref 275–301)
Potassium: 4.2 mmol/L (ref 3.5–5.1)
Sodium: 136 mmol/L (ref 136–145)

## 2013-11-17 LAB — CK TOTAL AND CKMB (NOT AT ARMC)
CK, TOTAL: 80 U/L
CK-MB: 1.4 ng/mL (ref 0.5–3.6)

## 2013-11-17 LAB — TROPONIN I

## 2013-11-17 LAB — APTT: ACTIVATED PTT: 33 s (ref 23.6–35.9)

## 2013-11-18 LAB — CK-MB
CK-MB: 1.4 ng/mL (ref 0.5–3.6)
CK-MB: 1.6 ng/mL (ref 0.5–3.6)

## 2013-11-18 LAB — LIPID PANEL
CHOLESTEROL: 110 mg/dL (ref 0–200)
HDL Cholesterol: 61 mg/dL — ABNORMAL HIGH (ref 40–60)
Ldl Cholesterol, Calc: 37 mg/dL (ref 0–100)
Triglycerides: 58 mg/dL (ref 0–200)
VLDL CHOLESTEROL, CALC: 12 mg/dL (ref 5–40)

## 2013-11-18 LAB — TROPONIN I: TROPONIN-I: 0.02 ng/mL

## 2013-11-19 LAB — BASIC METABOLIC PANEL
Anion Gap: 10 (ref 7–16)
BUN: 13 mg/dL (ref 7–18)
CHLORIDE: 102 mmol/L (ref 98–107)
CREATININE: 0.79 mg/dL (ref 0.60–1.30)
Calcium, Total: 8.9 mg/dL (ref 8.5–10.1)
Co2: 26 mmol/L (ref 21–32)
Glucose: 91 mg/dL (ref 65–99)
Osmolality: 275 (ref 275–301)
Potassium: 3.5 mmol/L (ref 3.5–5.1)
SODIUM: 138 mmol/L (ref 136–145)

## 2013-11-19 LAB — CBC WITH DIFFERENTIAL/PLATELET
BASOS ABS: 0.1 10*3/uL (ref 0.0–0.1)
BASOS PCT: 1.3 %
EOS ABS: 0.3 10*3/uL (ref 0.0–0.7)
Eosinophil %: 4.7 %
HCT: 37.3 % (ref 35.0–47.0)
HGB: 12.4 g/dL (ref 12.0–16.0)
LYMPHS PCT: 14.8 %
Lymphocyte #: 0.8 10*3/uL — ABNORMAL LOW (ref 1.0–3.6)
MCH: 30 pg (ref 26.0–34.0)
MCHC: 33.2 g/dL (ref 32.0–36.0)
MCV: 90 fL (ref 80–100)
Monocyte #: 0.7 x10 3/mm (ref 0.2–0.9)
Monocyte %: 11.8 %
NEUTROS ABS: 3.7 10*3/uL (ref 1.4–6.5)
Neutrophil %: 67.4 %
PLATELETS: 154 10*3/uL (ref 150–440)
RBC: 4.14 10*6/uL (ref 3.80–5.20)
RDW: 15.7 % — AB (ref 11.5–14.5)
WBC: 5.6 10*3/uL (ref 3.6–11.0)

## 2013-11-23 ENCOUNTER — Inpatient Hospital Stay: Payer: Self-pay | Admitting: Internal Medicine

## 2013-11-23 LAB — PROTIME-INR
INR: 1.1
Prothrombin Time: 13.7 secs (ref 11.5–14.7)

## 2013-11-23 LAB — COMPREHENSIVE METABOLIC PANEL
ALBUMIN: 3.5 g/dL (ref 3.4–5.0)
Alkaline Phosphatase: 107 U/L
Anion Gap: 7 (ref 7–16)
BILIRUBIN TOTAL: 0.7 mg/dL (ref 0.2–1.0)
BUN: 14 mg/dL (ref 7–18)
Calcium, Total: 9.1 mg/dL (ref 8.5–10.1)
Chloride: 102 mmol/L (ref 98–107)
Co2: 26 mmol/L (ref 21–32)
Creatinine: 0.93 mg/dL (ref 0.60–1.30)
GFR CALC NON AF AMER: 54 — AB
Glucose: 108 mg/dL — ABNORMAL HIGH (ref 65–99)
Osmolality: 271 (ref 275–301)
Potassium: 4 mmol/L (ref 3.5–5.1)
SGOT(AST): 27 U/L (ref 15–37)
SGPT (ALT): 22 U/L (ref 12–78)
SODIUM: 135 mmol/L — AB (ref 136–145)
TOTAL PROTEIN: 7.7 g/dL (ref 6.4–8.2)

## 2013-11-23 LAB — CBC
HCT: 37.9 % (ref 35.0–47.0)
HGB: 12.9 g/dL (ref 12.0–16.0)
MCH: 30.9 pg (ref 26.0–34.0)
MCHC: 34 g/dL (ref 32.0–36.0)
MCV: 91 fL (ref 80–100)
PLATELETS: 170 10*3/uL (ref 150–440)
RBC: 4.16 10*6/uL (ref 3.80–5.20)
RDW: 15.9 % — ABNORMAL HIGH (ref 11.5–14.5)
WBC: 5.7 10*3/uL (ref 3.6–11.0)

## 2013-11-23 LAB — CK TOTAL AND CKMB (NOT AT ARMC)
CK, Total: 72 U/L
CK-MB: 1.5 ng/mL (ref 0.5–3.6)

## 2013-11-23 LAB — TROPONIN I
Troponin-I: <0.02 ng/mL
Troponin-I: <0.02 ng/mL

## 2013-11-23 LAB — APTT: Activated PTT: 33.2 secs (ref 23.6–35.9)

## 2013-11-23 LAB — CK: CK, TOTAL: 59 U/L

## 2013-11-24 LAB — CK: CK, TOTAL: 54 U/L

## 2013-11-24 LAB — TROPONIN I: Troponin-I: 0.02 ng/mL

## 2013-11-25 LAB — CBC WITH DIFFERENTIAL/PLATELET
BASOS ABS: 0.1 10*3/uL (ref 0.0–0.1)
Basophil %: 1.2 %
Eosinophil #: 0.2 10*3/uL (ref 0.0–0.7)
Eosinophil %: 4.8 %
HCT: 36.7 % (ref 35.0–47.0)
HGB: 12.2 g/dL (ref 12.0–16.0)
LYMPHS PCT: 15.4 %
Lymphocyte #: 0.8 10*3/uL — ABNORMAL LOW (ref 1.0–3.6)
MCH: 30.5 pg (ref 26.0–34.0)
MCHC: 33.3 g/dL (ref 32.0–36.0)
MCV: 92 fL (ref 80–100)
Monocyte #: 0.6 x10 3/mm (ref 0.2–0.9)
Monocyte %: 12.4 %
NEUTROS ABS: 3.4 10*3/uL (ref 1.4–6.5)
Neutrophil %: 66.2 %
Platelet: 153 10*3/uL (ref 150–440)
RBC: 4 10*6/uL (ref 3.80–5.20)
RDW: 15.9 % — ABNORMAL HIGH (ref 11.5–14.5)
WBC: 5.1 10*3/uL (ref 3.6–11.0)

## 2013-11-25 LAB — BASIC METABOLIC PANEL
Anion Gap: 7 (ref 7–16)
BUN: 12 mg/dL (ref 7–18)
Calcium, Total: 8.9 mg/dL (ref 8.5–10.1)
Chloride: 104 mmol/L (ref 98–107)
Co2: 26 mmol/L (ref 21–32)
Creatinine: 0.89 mg/dL (ref 0.60–1.30)
EGFR (African American): >60
EGFR (Non-African Amer.): 57 — ABNORMAL LOW
Glucose: 92 mg/dL (ref 65–99)
Osmolality: 273 (ref 275–301)
POTASSIUM: 3.8 mmol/L (ref 3.5–5.1)
Sodium: 137 mmol/L (ref 136–145)

## 2013-11-26 LAB — CBC WITH DIFFERENTIAL/PLATELET
BASOS ABS: 0.1 10*3/uL (ref 0.0–0.1)
BASOS PCT: 1.3 %
EOS ABS: 0.3 10*3/uL (ref 0.0–0.7)
Eosinophil %: 5.2 %
HCT: 37.5 % (ref 35.0–47.0)
HGB: 12.5 g/dL (ref 12.0–16.0)
LYMPHS ABS: 1.1 10*3/uL (ref 1.0–3.6)
Lymphocyte %: 21.9 %
MCH: 30.5 pg (ref 26.0–34.0)
MCHC: 33.3 g/dL (ref 32.0–36.0)
MCV: 92 fL (ref 80–100)
MONO ABS: 0.6 x10 3/mm (ref 0.2–0.9)
MONOS PCT: 11.5 %
NEUTROS ABS: 3 10*3/uL (ref 1.4–6.5)
Neutrophil %: 60.1 %
Platelet: 162 10*3/uL (ref 150–440)
RBC: 4.1 10*6/uL (ref 3.80–5.20)
RDW: 15.5 % — ABNORMAL HIGH (ref 11.5–14.5)
WBC: 5 10*3/uL (ref 3.6–11.0)

## 2013-11-26 LAB — BASIC METABOLIC PANEL
Anion Gap: 8 (ref 7–16)
BUN: 17 mg/dL (ref 7–18)
CO2: 24 mmol/L (ref 21–32)
Calcium, Total: 9 mg/dL (ref 8.5–10.1)
Chloride: 103 mmol/L (ref 98–107)
Creatinine: 1 mg/dL (ref 0.60–1.30)
EGFR (Non-African Amer.): 49 — ABNORMAL LOW
GFR CALC AF AMER: 57 — AB
GLUCOSE: 85 mg/dL (ref 65–99)
Osmolality: 271 (ref 275–301)
Potassium: 3.8 mmol/L (ref 3.5–5.1)
SODIUM: 135 mmol/L — AB (ref 136–145)

## 2013-11-26 LAB — MAGNESIUM: Magnesium: 1.9 mg/dL

## 2013-11-27 ENCOUNTER — Ambulatory Visit (HOSPITAL_COMMUNITY)
Admission: AD | Admit: 2013-11-27 | Discharge: 2013-11-27 | Disposition: A | Discharge status: Home / Self Care | Payer: Medicare Other | Source: Other Acute Inpatient Hospital | Attending: Internal Medicine | Admitting: Internal Medicine

## 2013-11-27 DIAGNOSIS — I200 Unstable angina: Secondary | ICD-10-CM | POA: Insufficient documentation

## 2014-02-02 ENCOUNTER — Emergency Department: Payer: Self-pay | Admitting: Emergency Medicine

## 2014-08-31 NOTE — Consult Note (Signed)
PATIENT NAME:  Samantha Kerr, Samantha Kerr MR#:  [id number] DATE OF BIRTH:  [birth year]  DATE OF CONSULTATION:  12/15/2011  REFERRING PHYSICIAN:  Dr. Ginette Pitman CONSULTING PHYSICIAN:  Corey Skains, MD  PRIMARY CARE PHYSICIAN:  Dr. Ginette Pitman   REASON FOR CONSULTATION: Unstable angina.   CHIEF COMPLAINT: "I have chest pressure."   HISTORY OF PRESENT ILLNESS: This is an 81 year old female with known coronary artery disease status post previous PCI and stent placement and myocardial infarction who has also had atrial fibrillation with controlled ventricular rate on appropriate medications and anticoagulation. She also has hyperlipidemia well controlled today. The patient has had new onset of substernal chest pressure and pain radiating into her back and shoulder with shortness of breath as well. This has escalated to a significant degree over the last several days and most consistent with her angina consistent with previous myocardial infarction. The patient does have an EKG showing atrial fibrillation with nonspecific ST changes and poor R-wave progression. Troponin and CK-MB are within normal limits. The patient has had full relief of her chest pain at this time.   REVIEW OF SYSTEMS: The remainder review of systems is negative for vision change, ringing in the ears, hearing loss, cough, congestion, heartburn, nausea, vomiting, diarrhea, bloody stools, stomach pain, extremity pain, leg weakness, cramping of the buttocks, known blood clots, headaches, blackouts, dizzy spells, nosebleed, congestion, trouble swallowing, frequent urination, urination at night, muscle weakness, numbness, anxiety, depression, skin lesions, or skin rashes.   PAST MEDICAL HISTORY:  1. Chronic atrial fibrillation.  2. Hypertension.  3. Hyperlipidemia.  4. Coronary artery disease.   FAMILY HISTORY: No family members with early onset of cardiovascular disease or hypertension.   SOCIAL HISTORY: Currently denies alcohol or tobacco use.    ALLERGIES: No known drug allergies.   CURRENT MEDICATIONS: As listed.   PHYSICAL EXAMINATION:  VITAL SIGNS: Blood pressure 160/88 bilaterally, heart rate 92 upright, reclining, and regular.   GENERAL: She is a well appearing female in no acute distress.   HEENT: No icterus, thyromegaly, ulcers, hemorrhage, or xanthelasma.   CARDIOVASCULAR: Irregularly irregular with normal S1 and S2 with a 2/6 apical murmur consistent with mitral regurgitation. Point of maximal impulse is diffuse. Carotid upstroke normal without bruit. Jugular venous pressure is normal.   LUNGS: Lungs have few basilar crackles with normal respirations.   ABDOMEN: Soft, nontender, without hepatosplenomegaly or masses. Abdominal aorta is normal size without bruit.   EXTREMITIES: 2+ radial, femoral, dorsal pedal pulses with no lower extremity edema, cyanosis, clubbing, or ulcers.   NEUROLOGIC: She is oriented to time, place, and person with normal mood and affect.   ASSESSMENT:  81 year old female with coronary artery disease, previous myocardial infarction, hypertension, hyperlipidemia, chronic atrial fibrillation, abnormal EKG with chest pain consistent with angina and/or active cardiovascular disease.   RECOMMENDATIONS:  1. Continue serial ECG and enzymes to assess for possible myocardial infarction.  2. Continue her current heart rate control for atrial fibrillation.  3. Warfarin for further risk reduction in stroke with atrial fibrillation with a goal INR between 2 and 3.  4. Consider LexiScan infusion Myoview to assess for myocardial ischemia and further treatment thereof as necessary.  5. Continue aspirin for further risk reduction in in-stent thrombosis.  6. Hyperlipidemia with a goal LDL below 70 mg/dL without change today. 7. Further diagnostic testing and treatment options after above.       ____________________________ Corey Skains, MD bjk:bjt D: 12/15/2011 09:09:49 ET T: 12/15/2011 11:09:19  ET JOB#: [id number]  cc: Corey Skains, MD, <Dictator> Corey Skains MD ELECTRONICALLY SIGNED 12/18/2011 17:31

## 2014-08-31 NOTE — H&P (Signed)
PATIENT NAME:  Samantha Kerr, Samantha Kerr MR#:  [id number] DATE OF BIRTH:  [birth year]  DATE OF ADMISSION:  12/15/2011  PRIMARY CARE PHYSICIAN: Dr. Melynda Ripple   SUBJECTIVE: This is an 81 year old female with a history of coronary artery disease, status post stent placement in June 2011, atrial fibrillation on chronic Coumadin, hypertension, aortic stenosis, history of left breast cancer, status post lumpectomy and radiation who presents today with chest pain substernal in location associated with palpitations and diaphoresis. The patient's symptoms lasted for about an hour and she took three sublingual nitroglycerine with relief. However, the pain returned after she resumed activity and she decided to come back to the ER. She states that she saw her cardiologist, Dr. Saralyn Pilar, two weeks ago. She states that she has had a stress test since her stent placement in 2011, which was within normal limits. She denies any fever, chills, rigors or cough. Denies any recent history of travel.   PAST MEDICAL HISTORY:  1. Coronary artery disease, status post stent placement. 2. Cardiac catheterization in June 2011. Stent to mid LAD, proximal left main lesion of 20%, proximal LAD of 30%,   85%, mid LAD of 20%, first diagonal 25%.  3. Atrial fibrillation on chronic anticoagulation.  4. Hypertension.  5. Aortic stenosis.  6. Glaucoma of the right eye.  7. Shingles.  8. Constipation.  9. Diverticulosis.  10. Dyslipidemia.  11. Gastroesophageal reflux disease.  12. Left ventricular hypertrophy.  13. Breast cancer, status post lumpectomy.   PAST SURGICAL HISTORY:  1. Hysterectomy. 2. Stent placement. 3. Right foot surgery. 4. Right eye surgery. 5. Right cataract surgery.   ALLERGIES: Sulfa, Betagan, and Alphagan.   HOME MEDICATIONS:  1. Amlodipine 5 mg p.o. daily.  2. Vitamin D. 3. Colace 100 mg p.o. daily.  4. Imdur 30 mg p.o. daily.  5. Lisinopril 20 mg p.o. twice a day.  6. Lumigan  0.01%, one drop in each  eye daily.  7. Melatonin 3 mg p.o. at bedtime.  8. Metoprolol 25 mg p.o. twice a day.  9. Multivitamin 1 tablet p.o. daily.  10. Nitroglycerin 0.4 mg sublingual every five minutes up to three doses.  11. Ranitidine 150 mg p.o. daily.  12. Timolol 0.5% ophthalmic, one drop in each eye.  13. Warfarin 4-mg tablets, 2 mg once a day at 5:00 p.m.   SOCIAL HISTORY: She lives in [place] alone, denies any use of alcohol or drugs. She quit smoking 20 to 30 years ago.   FAMILY HISTORY: Positive for hypertension and coronary artery disease.   REVIEW OF SYSTEMS: CONSTITUTIONAL: No fever, chills, rigors, or weight loss. EYES: No inflammation. She has a history of glaucoma and cataracts. ENT: No epistaxis. Ears no drainage. RESPIRATORY: Denies any cough, wheezing, hemoptysis. Denies any shortness of breath with palpitations and chest pressure. CARDIOVASCULAR: As per history of present illness. Denies any worsening edema, presyncope, or syncope. GASTROINTESTINAL: No nausea, vomiting, or diarrhea. She actually has constipation. No abdominal pain, hematemesis, or melena. GENITOURINARY: No dysuria, hematuria. ENDOCRINE: No polyuria or polydipsia.  HEME: No easy bleeding or bruising. SKIN: No rash or ulcers. MUSCULOSKELETAL: She has sensitivity to her lower extremity with a history of phlebitis. No joint swelling. NEUROLOGIC: No one-sided weakness or numbness. No prior history of stroke or seizures.  PSYCHIATRIC: Denies any depression or suicidal ideation.   PHYSICAL EXAMINATION:  VITAL SIGNS:  Blood pressure 145/79?, respirations 16, pulse 69, 96% on room air.   GENERAL: Currently comfortable, in no acute cardiopulmonary distress.  HEENT: Pupils equal and reactive. Extraocular movements are intact. Sclerae anicteric.   NECK: Supple. No JVD. No adenopathy.   CARDIOVASCULAR: Irregularly irregular with a systolic ejection murmur 3/4 in intensity. No rubs or gallops.   LUNGS: Clear to auscultation bilaterally.  No use of accessory muscles.   ABDOMEN: Soft, nontender, positive bowel sounds. No mass or hepatosplenomegaly.   EXTREMITIES: Thin skin and varicosities. No edema. Dorsal pedis pulses are intact.   MUSCULOSKELETAL: No joint effusion.   NEUROLOGIC:   Motor strength intact bilaterally 5/5 in both upper and lower extremities.   LABORATORY, DIAGNOSTIC, AND RADIOLOGICAL DATA: Glucose 116, WBC 5.9, hemoglobin 15.5, hematocrit 45.8, platelet count 182, BUN 13, creatinine 0.88, potassium 4, bicarbonate 27, calcium 9, osmolality 60, troponin less than 0.02. INR is 2.2. Pulmonary venous hypertension without frank pulmonary edema on chest x-ray.   ASSESSMENT AND PLAN:  1. Chest pain, atypical, associated with exertion, relieved with sublingual nitroglycerin and rest. Admit the patient for chest pain rule-out. The patient's initial EKG shows atrial fibrillation which is rate controlled. She has a coronary artery stent and will be continued on aspirin. She has a bare metal stent that was placed in June 2011.  We will schedule her for LexiScan Myoview in the morning and consult cardiology.  2. Atrial fibrillation, rate controlled on metoprolol, on anticoagulation. Continue Coumadin per pharmacy.  3. Hypertension. Continue metoprolol and p.r.n. hydralazine for blood pressure control.       4. CODE STATUS: She is a FULL CODE.  TOTAL TIME SPENT ON HISTORY AND PHYSICAL: 55 minutes.   ____________________________ Reyne Dumas, MD na:bjt D: 12/14/2011 23:09:36 ET T: 12/15/2011 08:27:44 ET JOB#: [id number]  cc: Reyne Dumas, MD, <Dictator> Larwance Sachs, MD Reyne Dumas MD ELECTRONICALLY SIGNED 12/15/2011 22:54

## 2014-09-03 NOTE — Consult Note (Signed)
Brief Consult Note: Diagnosis: CVA, hx LGIB.   Patient was seen by consultant.   Consult note dictated.   Discussed with Attending MD.   Comments: Ms Trigg was just discharged over the weekend after being adimtted for diarrhea and rectal bleeding. The decision was made to stop coumadin and not perform endoscpies due to her age and comorbidities.   Now presenting with CVA.    No further rectal bleeding or diarrhea.  Hgb is stable from discharge.   I suspect tthe diarrhea and rectal bleeding was either from ischemic colitis or acute infection.   Given these likely diagnoses and her advanced age and severe comorbidities,  I would recommend starting anti-coagulation if clinically indicated without going ahead with any endoscopy.  If she were to develop any GI bleeding,  we can decide at that time whether to perform endoscopy or stop the anti-coagulation.   Starting a heparin drip initially would be a reasonable option since it can be discontinued quickly.  Electronic Signatures: Arther Dames (MD)  (Signed 15-Oct-14 12:35)  Authored: Brief Consult Note   Last Updated: 15-Oct-14 12:35 by Arther Dames (MD)

## 2014-09-03 NOTE — Consult Note (Signed)
CC: GI bleeding.  Pt likely suffering from LGI bleed related partly to anticoagulation, her BUN is normal and usually it is elevated in UGI bleeding.  Hgb today not falling.  Her bleeding scan is pending.  Discussed complexity of case with daughter in law concerning aortic stenosis of severe nature and increased cardiac risk with any procedures and chances of CVA or MI from decreased cardiac output.  Pt last colonoscopy 04/2006 with 3 small polyps and diverticulosis.  Also discussed small but definite chance of CVA without anticoagulation.  INR now down to 1.4.  Await bleeding scan, follow serial HGB,  need to know if surface area of valve opening is below 1cm or not.  Electronic Signatures: Manya Silvas (MD)  (Signed on 09-Oct-14 17:14)  Authored  Last Updated: 09-Oct-14 17:14 by Manya Silvas (MD)

## 2014-09-03 NOTE — H&P (Signed)
PATIENT NAME:  Samantha Kerr, Samantha Kerr MR#:  [id number] DATE OF BIRTH:  1920/12/15  Dictated for Tracie Harrier, MD  DATE OF ADMISSION:  02/19/2013  CHIEF COMPLAINT: Rectal bleeding.   HISTORY OF PRESENT ILLNESS: A 81 year old female with a history of heart disease, atrial fibrillation, aortic stenosis, hypertension, hyperlipidemia, glaucoma, and diverticulosis who presented to the clinic on 10/06 with complaints of rectal bleeding. She had seen blood, bright red, mixed with her stool starting on 10/04. She denied any abdominal pain, lightheadedness. Her Coumadin was held that night. Her CBC revealed a change in her hemoglobin from  14.3 on 01/26/2013, on down to 11.7. She on discussion on the phone continued to feel fine, and her CBC was repeated the following day on 10/08 showing her hemoglobin had further dropped to 10.2. She was seen today in GI for further evaluation, and it was recommended the patient should be admitted for further evaluation. Currently she denies any lightheadedness, shortness of breath, chest pain. She does feel weaker than she did earlier in the week. Otherwise she denies any new complaints.   PAST MEDICAL HISTORY: 1.  Glaucoma.  2.  Atrial fibrillation.  3.  Coronary artery disease.  4.  Aortic stenosis, severe.  5.  Gastroesophageal reflux.  6.  Diverticulosis.  7.  Hyperlipidemia.  8.  Hypertension.   PAST SURGICAL HISTORY: 1.  Lumpectomy and radiation for breast cancer.  2.  Back surgery.  3.  Bare metal stents, 2011.   ALLERGIES: SULFA.   CURRENT MEDICATIONS: Per current medications lists:  1.  Lisinopril 20 mg b.i.d. 2.  Metoprolol 50 mg one-half tablet b.i.d.  3.  Nitroglycerin 0.4 mg sublingual p.r.n.  4.  Warfarin 4 mg, 2 tablets daily.  5.  Colace 100 mg p.r.n.  6.  Zantac 150 mg at bedtime.  7.  Amlodipine 5 mg daily.  8.  Lumigan eyedrops 0.01%, 1 drop both eyes at bedtime.  9.  Dorzolamide 2% drops, one drop both eyes t.i.d.  10.  Imdur 60 mg once a  day.   SOCIAL HISTORY: Denies alcohol or drugs. She quit smoking 20 or 30 years ago.   FAMILY HISTORY: Positive for hypertension, coronary artery disease.   REVIEW OF SYSTEMS: As per HPI no fevers or chills. No weight loss. She has glaucoma that has worsened, and is followed by ophthalmology. She denies any nosebleeds. Her respiratory status is stable, without worsening shortness of breath. No hemoptysis. No worsening lower extremity edema. No abdominal pain or recent constipation. She denies any urinary changes.   PHYSICAL EXAMINATION: GENERAL EXAM:  Elderly female appearing younger than her stated age. No acute distress.  VITALS: Weight 165. Blood pressure 118/70. Temperature is 98, pulse 73, her O2 sat is 95% on room air.  HEENT EXAM: Shows head is normocephalic, atraumatic. Pupils equal, round, reactive to light. EOMs intact. Conjunctiva are not pale. Her oropharynx shows normal dentition with posterior pharynx. No erythema. Ears showing right cerumen impaction. Left ears with normal landmarks, clear TM.  NECK: Supple, with no thyromegaly, no adenopathy.  HEART EXAM: Shows irregular rhythm with rate in the 60Y and a 3/6 systolic murmur heard best at sternal border.  LUNGS: Clear to auscultation bilaterally.  ABDOMEN: Soft, nontender, normal active bowel sounds.  EXTREMITIES: No edema. Distal pulses are 2+ and symmetric. Normal capillary refill.  NEUROLOGIC EXAM: Is nonfocal.    ASSESSMENT AND PLAN: 1.  Gastrointestinal bleed: I had a discussion with Gastroenterology, Dr. Vira Agar. Will obtain a stat GI bleed  scan, a stat CBC, MET-C, protime. Type-and-cross and cross-match for 2 units. She is to have large-bore IVs, 18-gauge x 2. Saline locks for now. Will continue to hold Coumadin. Will keep patient n.p.o. until further evaluation by gastroenterology.  2.  Atrial fibrillation: Will continue to hold Coumadin. Continue metoprolol 25 mg b.i.d. Will place on telemetry.  3.  Hypertension: Hold  Norvasc. Cut lisinopril down to 10 mg daily. Continue metoprolol. Hold medicines for systolic blood pressure less than 110.  4.  Coronary artery disease: Will hold Imdur for now. Consider restarting depending on GI bleed status. Consider consulting cardiology. Currently she is not having chest pain.  5. Aortic stenosis: Last echocardiogram on 02/03/2013 showing EF of 60% with normal LV systolic function, moderate RV enlargement, moderate-to-severe biatrial enlargement. Severe aortic stenosis.  6.  Glaucoma: Continue her eyedrops, Lumigan and dorzolamide. The patient does report increasing her dorzolamide recently per ophthalmology due to increasing pressures.    ____________________________ Marily Lente. Keigo Whalley, PA-C rjt:dm D: 02/19/2013 12:53:36 ET T: 02/19/2013 13:26:44 ET JOB#: [id number]  cc: Marily Lente. Vidal Schwalbe, PA-C, <Dictator> Tracie Harrier, MD Leonard Downing PA ELECTRONICALLY SIGNED 02/19/2013 17:05

## 2014-09-03 NOTE — Consult Note (Signed)
CC: LGI bleed.  Pt hgb up and down, no further bleeding, tol full liquid diet well.  No abd pain, no vomiting, no diarrhea.  Start 81mg  asa tomorrow and keep on this to try to prevent CVA without causing GI bleeding.  Maybe home tomorrow.  Electronic Signatures: Manya Silvas (MD)  (Signed on 24-Oct-14 17:19)  Authored  Last Updated: 24-Oct-14 17:19 by Manya Silvas (MD)

## 2014-09-03 NOTE — Consult Note (Signed)
CC: LGI bleed, her cardiac disease would indicate that she needs to be transfused if she falls below 9 or if she gets symptoms in 9-10 range. Would restart 81mg  asa. in a few days.  Electronic Signatures: Manya Silvas (MD)  (Signed on 22-Oct-14 09:09)  Authored  Last Updated: 22-Oct-14 09:09 by Manya Silvas (MD)

## 2014-09-03 NOTE — H&P (Signed)
PATIENT NAME:  Samantha Kerr, Samantha Kerr MR#:  [id number] DATE OF BIRTH:  [birth year]  DATE OF ADMISSION:  03/03/2013  PRIMARY CARE PHYSICIAN: Tracie Harrier, MD.  GASTROENTEROLOGIST: Gaylyn Cheers, MD.  CHIEF COMPLAINT: Two episodes of rectal bleeding as per patient. It was bright red blood.   HISTORY OF PRESENT ILLNESS: This is a 81 year old female with history of atrial fibrillation who had a recent GI bleed while she was on Coumadin. Her Coumadin was held, and then she had a stroke, and then she was placed on aspirin and Plavix and now presents back with 2 episodes of bright red blood per rectum. As per the patient, she feels tired. No abdominal pain. No nausea or vomiting, soreness on the rectum.   In the ER, she was hemodynamically stable, and hemoglobin was 11.2 which was similar to her previous hemoglobin. Hospitalist services were contacted for further evaluation.   PAST MEDICAL HISTORY: Atrial fibrillation, severe aortic stenosis, recent CVA, GI bleed, hypertension, hyperlipidemia, glaucoma, diverticulosis, coronary artery disease, history of gastroesophageal reflux disease.   PAST SURGICAL HISTORY: Hysterectomy, back surgery, pacemaker.   ALLERGIES: ALPHAGAN, BETAGAN AND SULFA.   MEDICATIONS: List includes aspirin 81 mg daily, atorvastatin 40 mg daily, Plavix 75 mg daily, dorzolamide ophthalmic solution 2% one drop each eye 3 times a day, Lumigan 0.01% ophthalmic solution one drop each eye in the morning, magnesium oxide 400 mg twice a day, metoprolol 50 mg half  tablet twice a day.   SOCIAL HISTORY: Currently at [facility] [employer]. Used to work in [employer] in the past. No alcohol or drug use. Quit smoking 30 years back.   FAMILY HISTORY: Father died at 68 of some sort of cancer. Mother died at 90 of heart disease.   REVIEW OF SYSTEMS:  CONSTITUTIONAL: No fever, chills, or sweats. No weight loss. No weight gain.  EYES: She does wear glasses and has decreased vision in the right eye.  ENT:  Decreased hearing. Positive runny nose. No sore throat. No difficulty swallowing.  CARDIOVASCULAR: Occasional palpitations. Occasional chest pressure.  RESPIRATORY: Shortness of breath with exertion. No coughing. No sputum. No hemoptysis.  GASTROINTESTINAL: No nausea. No vomiting. No abdominal pain. Bright red blood per rectum as per patient.  GENITOURINARY: No burning on urination or hematuria.  MUSCULOSKELETAL: No joint pain.  INTEGUMENT: No rashes but some spots that itch on her body.  NEUROLOGIC: No fainting or blackouts.  PSYCHIATRIC: No anxiety or depression.  ENDOCRINE: No thyroid problems.  HEMATOLOGIC AND LYMPHATIC: History of anemia.   PHYSICAL EXAMINATION:  VITAL SIGNS: Temperature 97.8, pulse 68, respirations 18, blood pressure 172/80, pulse oximetry 95% on room air.  GENERAL: No respiratory distress.  EYES: Conjunctivae and lids normal. Extraocular muscles intact. Both pupils are reactive to light.  ENT: Tympanic membranes: No erythema. Nasal mucosa: No erythema. Throat: No erythema. No exudate seen. Lips and gums: No lesions.  NECK: No JVD. No bruits. No lymphadenopathy. No thyromegaly. No thyroid nodules palpated.  RESPIRATORY: Lungs clear to auscultation. No use of accessory muscles to breathe. No rhonchi, rales, or wheeze heard.  CARDIOVASCULAR: S1, S2 normal. Positive 4/6 systolic ejection murmur. Carotid upstroke 2+ bilaterally. No bruits. Dorsalis pedis pulses 2+ bilaterally. Trace edema of the lower extremity.  ABDOMEN: Soft, nontender. No organomegaly/splenomegaly. Normoactive bowel sounds. No masses felt.  LYMPHATIC: No lymph nodes in the neck.  MUSCULOSKELETAL: Trace edema and no clubbing. No cyanosis.  SKIN: No ulcers seen.  NEUROLOGIC: Cranial nerves II through XII grossly intact. Deep tendon reflexes 1+ bilateral lower extremity.  The patient is able to straight-leg raise bilaterally.  PSYCHIATRIC: Oriented to person and place.   LABORATORY AND RADIOLOGICAL DATA:  Troponin negative. White blood cell count 4.8, H and H 11.2 and 32.5, platelet count of 217, glucose 100, BUN 12, creatinine 0.96, sodium 135, potassium 4.2, chloride 103, CO2 29, calcium 8.8. Liver function tests normal range.   URINALYSIS: Trace leukocyte esterase.   EKG: Paced.   ASSESSMENT AND PLAN:  1.  Gastrointestinal bleed, likely lower in nature. Will hold aspirin and Plavix. Case discussed with Dr. Vira Agar who has seen the patient in the previous admissions. Will get serial hemoglobins. Transfuse if needed. Currently no need for transfusion right now. We will put on clear liquid diet and give IV fluid hydration, IV Protonix. If the patient really hemorrhages, can consider platelet transfusion but at this point will hold off.  2.  Atrial fibrillation with recent cerebrovascular accident. We are unable to give anticoagulation at this time. Very high risk for repeat stroke. The patient has failed Coumadin with episode of bleeding and also failed aspirin and Plavix with an episode of bleeding.  3.  Severe aortic stenosis.  4.  Hypertension, on metoprolol.  5.  Hyperlipidemia, on atorvastatin.  6.  Glaucoma. Continue her eye drops.  7.  Gastroesophageal reflux disease. Put on IV Protonix for right now.   TIME SPENT ON ADMISSION: 55 minutes.   CODE STATUS: DO NOT RESUSCITATE.   ____________________________ Tana Conch. Leslye Peer, MD rjw:np D: 03/03/2013 16:29:44 ET T: 03/03/2013 16:44:31 ET JOB#: [id number]  cc: Tana Conch. Leslye Peer, MD, <Dictator> Tracie Harrier, MD Manya Silvas, MD Marisue Brooklyn MD ELECTRONICALLY SIGNED 03/09/2013 11:32

## 2014-09-03 NOTE — Consult Note (Signed)
Pt CC LGI bleed,  doing well, no bleeding, hgb stable and climbing a bit.  Discussed with Dr. Ginette Pitman and agree with advancement of diet to full liquids and will start 81mg  ASA tomorrow for stroke prophylaxis and if does well maybe discharge over weekend.  Electronic Signatures: Manya Silvas (MD)  (Signed on 23-Oct-14 17:15)  Authored  Last Updated: 23-Oct-14 17:15 by Manya Silvas (MD)

## 2014-09-03 NOTE — Consult Note (Signed)
Referring Physician:  Theodoro Grist :   Primary Care Physician:  [facility], [address] [place], [place] [address], [phone]  Reason for Consult: Admit Date: 24-Feb-2013  Chief Complaint: CVA  Reason for Consult: CVA   History of Present Illness: History of Present Illness:   81 year old woman with afib recently admitted for UGI bleed resulting in her warfarin being stopped presents now with mild confusion, vision deficit and reported LLE weakness concerning for stroke.  It seems that most of her symptoms have improved.  In the morning, patients family had reported that she began having new difficulties with walking with possible LLE weakness.  Patient seemed confused to them in the AM.  Symptoms were moderate severity.  Symptoms were constant.  No clear provoking factors but concern for stroke since patient's warfarin was recently stopped.  Given these symptoms patient was admitted to eval for stroke.  MEDICAL HISTORY: fibrillation. The patient was on Coumadin, now taken off; aortic stenosis. Echocardiogram most recently done on 02/20/2013 which showed ejection fraction of 55% to 60%,  global normal left ventricular function, mild concentric LVH, restrictive pattern of diastolic filling, dilated left as well as right atrium, moderate to severe mitral annular calcification, moderate to severe marginal valve regurgitation as well as moderate aortic valve stenosis and elevated pulmonary arterial pressures. Also history significant for severe aortic stenosis as mentioned above during echocardiogram was performed just this admission, artery disease,reflux disease.  known neurosurgeries. HISTORY: patient denies alcohol or drugs, and quit smoking 20 or 30 years ago.  HISTORY:artery disease.  20 mg p.o. twice daily, 25 mg twice daily, 0.05% once daily 2% drops 3 times daily.   ALLERGIES:     ROS:  General denies complaints   HEENT no  complaints   Lungs no complaints   Cardiac no complaints   GI no complaints   GU no complaints   Musculoskeletal no complaints   Extremities no complaints   Skin no complaints   Endocrine no complaints   Psych no complaints   Past Medical/Surgical Hx:  Glaucoma:   Atrial Fibrillation:   HTN:   cardiac stents:   Back Surgery:   Hysterectomy - Total:   Home Medications: Medication Instructions Last Modified Date/Time  lisinopril 20 mg oral tablet 1 tab(s) orally 2 times a day 14-Oct-14 17:00  metoprolol tartrate 50 mg oral tablet 0.5 tab ($Remove'25mg'wriqjjI$ ) orally 2 times a day. 14-Oct-14 17:00  Lumigan 0.01% ophthalmic solution 1 drop(s) in each eye once a day 14-Oct-14 17:00  dorzolamide ophthalmic 2% ophthalmic solution 1 drop(s) to each affected eye 3 times a day 14-Oct-14 17:00   Allergies:  Sulfa drugs: Unknown  Betagan: Unknown  Alphagan: Unknown  Vital Signs: **Vital Signs.:   15-Oct-14 04:26  Vital Signs Type Routine  Temperature Temperature (F) 97.7  Celsius 36.5  Temperature Source oral  Pulse Pulse 85  Respirations Respirations 20  Systolic BP Systolic BP 594  Diastolic BP (mmHg) Diastolic BP (mmHg) 79  Mean BP 101  Pulse Ox % Pulse Ox % 92  Pulse Ox Activity Level  At rest  Oxygen Delivery Room Air/ 21 %   EXAM: GENERAL: Pleasant.  Excellent cognition.  NAD.  Normocephalic and atraumatic.  EYES: Funduscopic exam shows normal disc size, appearance and C/D ratio without clear evidence of papilledema.  CARDIOVASCULAR: S1 and S2 sounds are within normal limits, without murmurs, gallops, or rubs.  MUSCULOSKELETAL: Bulk - Normal Tone - Normal Pronator Drift -  Absent bilaterally. Ambulation - Deferred due to falls risk for possible stroke.  R/L 4/4    Shoulder abduction (deltoid/supraspinatus, axillary/suprascapular n, C5) 4/4    Elbow flexion (biceps brachii, musculoskeletal n, C5-6) 4/4    Elbow extension (triceps, radial n, C7) 4/4    Finger  adduction (interossei, ulnar n, T1)   4/4    Hip flexion (iliopsoas, L1/L2) 4/4    Knee flexion (hamstrings, sciatic n, L5/S1) 4/4    Knee extension (quadriceps, femoral n, L3/4) 4/4    Ankle dorsiflexion (tibialis anterior, deep fibular n, L4/5) 4/4    Ankle plantarflexion (gastroc, tibial n, S1)   NEUROLOGICAL: MENTAL STATUS: Patient is oriented to person, place and time.  Recent and remote memory are intact.  Attention span and concentration are intact.  Naming, repetition, comprehension and expressive speech are within normal limits.  Patient's fund of knowledge is within normal limits for educational level.  CRANIAL NERVES: Normal    CN II (normal visual acuity and visual fields) Normal    CN III, IV, VI (extraocular muscles are intact) Normal    CN V (facial sensation is intact bilaterally) Normal    CN VII (facial strength is intact bilaterally) Abnormal    CN VIII (hearing is decreased bilaterally) Normal    CN IX/X (palate elevates midline, normal phonation) Normal    CN XI (shoulder shrug strength is normal and symmetric) Normal    CN XII (tongue protrudes midline)   SENSATION: Intact to pain and temp bilaterally (spinothalamic tracts) Intact to position and vibration bilaterally (dorsal columns)   REFLEXES: R/L 2+/2+    Biceps 2+/2+    Brachioradialis   2+/2+    Patellar 2+/2+    Achilles   COORDINATION/CEREBELLAR: Finger to nose testing is within normal limits..  Lab Results:  Hepatic:  15-Oct-14 05:12   Bilirubin, Total 0.7  Alkaline Phosphatase 70  SGPT (ALT) 17  SGOT (AST) 16  Total Protein, Serum  6.3  Albumin, Serum  2.9  Routine Chem:  15-Oct-14 05:12   Cholesterol, Serum 134  Triglycerides, Serum 99  HDL (INHOUSE) 58  VLDL Cholesterol Calculated 20  LDL Cholesterol Calculated 56 (Result(s) reported on 25 Feb 2013 at 06:04AM.)  Glucose, Serum 93  BUN 11  Creatinine (comp) 0.86  Sodium, Serum 138  Potassium, Serum 3.5  Chloride, Serum 106   CO2, Serum 27  Calcium (Total), Serum 8.5  Osmolality (calc) 275  eGFR (African American) >60  eGFR (Non-African American)  59 (eGFR values <45mL/min/1.73 m2 may be an indication of chronic kidney disease (CKD). Calculated eGFR is useful in patients with stable renal function. The eGFR calculation will not be reliable in acutely ill patients when serum creatinine is changing rapidly. It is not useful in  patients on dialysis. The eGFR calculation may not be applicable to patients at the low and high extremes of body sizes, pregnant women, and vegetarians.)  Anion Gap  5  Cardiac:  14-Oct-14 12:05   CK, Total 110 (Result(s) reported on 24 Feb 2013 at 12:42PM.)  CPK-MB, Serum 2.5 (Result(s) reported on 24 Feb 2013 at 12:42PM.)  15-Oct-14 05:12   Troponin I < 0.02 (0.00-0.05 0.05 ng/mL or less: NEGATIVE  Repeat testing in 3-6 hrs  if clinically indicated. >0.05 ng/mL: POTENTIAL  MYOCARDIAL INJURY. Repeat  testing in 3-6 hrs if  clinically indicated. NOTE: An increase or decrease  of 30% or more on serial  testing suggests a  clinically important change)  Routine UA:  14-Oct-14 14:28  Color (UA) Yellow  Clarity (UA) Cloudy  Glucose (UA) Negative  Bilirubin (UA) Negative  Ketones (UA) Negative  Specific Gravity (UA) 1.010  Blood (UA) Negative  pH (UA) 7.0  Protein (UA) Negative  Nitrite (UA) Negative  Leukocyte Esterase (UA) Trace (Result(s) reported on 24 Feb 2013 at 03:00PM.)  RBC (UA) 4 /HPF  WBC (UA) 3 /HPF  Bacteria (UA) NONE SEEN  Epithelial Cells (UA) 1 /HPF  Mucous (UA) PRESENT (Result(s) reported on 24 Feb 2013 at 03:00PM.)  Routine Hem:  15-Oct-14 05:12   WBC (CBC) 4.8  RBC (CBC)  3.50  Hemoglobin (CBC)  10.8  Hematocrit (CBC)  31.8  Platelet Count (CBC) 174  MCV 91  MCH 30.8  MCHC 34.0  RDW  15.2  Neutrophil % 65.7  Lymphocyte % 15.0  Monocyte % 13.9  Eosinophil % 4.2  Basophil % 1.2  Neutrophil # 3.2  Lymphocyte #  0.7  Monocyte # 0.7   Eosinophil # 0.2  Basophil # 0.1 (Result(s) reported on 25 Feb 2013 at 05:55AM.)   Radiology Results: [place]:    14-Oct-14 15:57, US Carotid Doppler Bilateral  US Carotid Doppler Bilateral   REASON FOR EXAM:    cva with stroke in occipital area  COMMENTS:       PROCEDURE: [place]  - US CAROTID DOPPLER BILATERAL  - Feb 24 2013  3:57PM     RESULT: Grayscale and color flow Doppler techniques were employed to   evaluate the cervical carotid arteries.    Both right and left carotid systems reveal small to moderate amounts of   calcified plaque at the carotid bulbs and proximal internal carotid   arteries. There is a small amount of calcified plaque in the distal CCA   on the right as well. Thewaveform patterns and color flow images do not   suggest significant stenoses. The cardiac rhythm is irregular.    On the right the peak internal carotid systolic velocity measured 73     cm/sec and the peak common carotid velocity measured 99 cm/sec  corresponding to a normal ratio of 0.7. On the left the peak internal   carotid systolic velocity measured 57 cm/sec and the peak common carotid   velocity measured 69 cm/sec corresponding to a normal ratio of 0.8.    The vertebral arteries are normal in flow direction bilaterally.    IMPRESSION:    1. There is no evidence of a hemodynamically significant carotid   stenosis. There is a small amount of calcified plaque in both carotid   systems.  2. The vertebral arteries appear patent and normal in flow direction.  3. The cardiac rhythm is irregular.     Dictation Site: 2    Verified By: DAVID A. Martinique, M.D., MD  CT:    14-Oct-14 12:23, CT Head Without Contrast  CT Head Without Contrast   REASON FOR EXAM:    weakness  COMMENTS:       PROCEDURE: CT  - CT HEAD WITHOUT CONTRAST  - Feb 24 2013 12:23PM     RESULT: History: Weakness.    Comparison Study: No prior.    Fines: Standard nonenhanced CT obtained. Lucency is noted in the right    occipital lobe consistent with an infarction. This may be subacute. No   hemorrhage. No mass effect. No hydrocephalus. No acute bony abnormality.    IMPRESSION:  Right occipital large area of infarction.    Verified By: Osa Craver, M.D., MD   Impression/Recommendations: Recommendations:  81 year old woman with afib recently admitted for UGI bleed resulting in her warfarin being stopped presents now with mild confusion, vision deficit and reported LLE weakness concerning for stroke.  the bulk of her symptoms have resolved, she is seen to have had an acute occipital stroke on the right side.  Discussed with patient and family.  Given coming off her coumadin, this was likely cardioembolic.  Carotid doppler negative for stenosis.  Given known afib doesnt need telemetry.  Rec TTE to eval for intracardiac thrombus.  Rec she be started on plavix 75 mg daily, would not restart anticoagulation this early after large right occipital stroke in 81 year old woman despite her relatively good health. 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 TREATMENT:Per ATP III guidlines, given that patient has additional stroke risk factors, start or increase statin if LDL > 70 PRESSURE CONTROL:Allow permissive HTN for BP less than 220/110Restart home antihypertensive medications prior to discharge. PT, OT Consult speech therapy for  swallowing assessment and stroke education.Patient approved for PO with swallow study.Fall PrecautionsAdvised no smoking.DVT prophylaxis with  lovenox or heparin.  have reviewed the results of the most recent imaging studies, tests and labs as outlined above and answered all related questions. and coordinated plan of care with hospitalist.   Electronic Signatures: Anabel Bene (MD)  (Signed 16-Oct-14 06:05)  Authored: REFERRING PHYSICIAN, Primary Care Physician, Consult, History of Present Illness, Review of Systems, PAST MEDICAL/SURGICAL HISTORY, HOME MEDICATIONS, ALLERGIES, NURSING VITAL SIGNS, Physical Exam-, LAB RESULTS, RADIOLOGY RESULTS, Recommendations   Last Updated: 16-Oct-14 06:05 by Anabel Bene (MD)

## 2014-09-03 NOTE — Discharge Summary (Signed)
PATIENT NAME:  Samantha Kerr, Samantha Kerr MR#:  [id number] DATE OF BIRTH:  1920-07-21  DATE OF ADMISSION:  02/19/2013 DATE OF DISCHARGE:  02/22/2013  DISCHARGE DIAGNOSES: 1. Blood loss anemia from gastrointestinal bleed.  2. Gastrointestinal bleed, unclear, upper or lower, moderate, acute.   DISCHARGE MEDICATIONS: Per [facility] med reconciliation system. Please see for details. She basically was taken off several antihypertensives secondary to blood pressure being lower and she had no chest pain even off of the Imdur. She also had her warfarin stopped secondary to GI bleed.   HISTORY AND PHYSICAL: Please see detailed history and physical done on admission by Dr. Ginette Pitman.   HOSPITAL COURSE: The patient was admitted with dropping hemoglobin, signs of potential melena. GI asked her to be admitted. After admission GI decided not to do any endoscopies. Her hemoglobin then remained stable at about the 10 range where she had dropped down to recently. Warfarin was held. Bleeding scan was done that showed no active hemorrhage. She had some cardiomegaly. She also had echocardiogram done, which showed moderate aortic stenosis, normal LV function. INR was 1.2 this morning which is basically normal and hemoglobin was  11.1. She will be discharged home with early follow-up with Dr. Ginette Pitman to monitor her further. Per GI and Dr. Ginette Pitman may consider restarting her warfarin in the future as deemed safe to do so. However, I think she would need to be off this at least a few weeks to make sure this bleeding is totally abated and any sites of bleeding have completely healed.   TIME SPENT: It took approximately 35 minutes to do all discharge tasks.    ____________________________ Ocie Cornfield. Ouida Sills, MD mwa:sg D: 02/22/2013 10:50:05 ET T: 02/22/2013 11:20:10 ET JOB#: [id number]  cc: Ocie Cornfield. Ouida Sills, MD, <Dictator> Kirk Ruths MD ELECTRONICALLY SIGNED 02/23/2013 8:03

## 2014-09-03 NOTE — Consult Note (Signed)
Pt CC LGI bleed.  No sign of any further bleeding.  Hgb stable, VSS, pt feels weak and SOB with exertion.  Chest clear, heart 2-4/0 systolic murmur, irreg irreg heart.  Recommend continue full liquid diet for 3 days or so before advancing to mechanical soft low residue.  If she does not bleed any further I will not do the colon given her age and heart.  If she rebleeds I will likely try to do it.  Electronic Signatures: Manya Silvas (MD)  (Signed on 10-Oct-14 17:23)  Authored  Last Updated: 10-Oct-14 17:23 by Manya Silvas (MD)

## 2014-09-03 NOTE — Consult Note (Signed)
Chief Complaint:  Subjective/Chief Complaint Covering for Dr. Vira Agar. No abd pain. No bleeding. Off coumadin.   VITAL SIGNS/ANCILLARY NOTES: **Vital Signs.:   11-Oct-14 08:59  Vital Signs Type Q 4hr  Temperature Temperature (F) 98  Celsius 36.6  Temperature Source oral  Pulse Pulse 68  Respirations Respirations 20  Systolic BP Systolic BP 831  Diastolic BP (mmHg) Diastolic BP (mmHg) 54  Mean BP 72  Pulse Ox % Pulse Ox % 97  Pulse Ox Activity Level  At rest  Oxygen Delivery Room Air/ 21 %   Brief Assessment:  GEN no acute distress   Cardiac Irregular  murmur present   Respiratory clear BS   Gastrointestinal Normal   Lab Results: Routine Chem:  11-Oct-14 04:09   Glucose, Serum 97  BUN 7  Creatinine (comp) 0.82  Sodium, Serum 138  Potassium, Serum 3.5  Chloride, Serum  108  CO2, Serum 24  Calcium (Total), Serum 8.7  Anion Gap  6  Osmolality (calc) 274  eGFR (African American) >60  eGFR (Non-African American) >60 (eGFR values <35mL/min/1.73 m2 may be an indication of chronic kidney disease (CKD). Calculated eGFR is useful in patients with stable renal function. The eGFR calculation will not be reliable in acutely ill patients when serum creatinine is changing rapidly. It is not useful in  patients on dialysis. The eGFR calculation may not be applicable to patients at the low and high extremes of body sizes, pregnant women, and vegetarians.)  Routine Hem:  11-Oct-14 04:09   WBC (CBC) 5.4  RBC (CBC)  3.68  Hemoglobin (CBC)  11.5  Hematocrit (CBC)  33.0  Platelet Count (CBC) 151  MCV 90  MCH 31.4  MCHC 35.0  RDW  15.2  Neutrophil % 67.4  Lymphocyte % 16.2  Monocyte % 13.2  Eosinophil % 2.4  Basophil % 0.8  Neutrophil # 3.6  Lymphocyte #  0.9  Monocyte # 0.7  Eosinophil # 0.1  Basophil # 0.0 (Result(s) reported on 21 Feb 2013 at 05:16AM.)   Assessment/Plan:  Assessment/Plan:  Assessment LGI bleeding. Probably diverticular. No bleeding now off  coumadin.   Plan See Dr. Percell Boston recommendation. If no further bleeding, then can be discharged by Monday. If rebleeding, let us know. [facility], Dr. Vira Agar will see patient on Monday. Thanks.   Electronic Signatures: Verdie Shire (MD)  (Signed 11-Oct-14 10:46)  Authored: Chief Complaint, VITAL SIGNS/ANCILLARY NOTES, Brief Assessment, Lab Results, Assessment/Plan   Last Updated: 11-Oct-14 10:46 by Verdie Shire (MD)

## 2014-09-03 NOTE — Discharge Summary (Signed)
PATIENT NAME:  Samantha Kerr, Samantha Kerr MR#:  [id number] DATE OF BIRTH:  Feb 04, 1921  DATE OF ADMISSION:  02/24/2013 DATE OF DISCHARGE: 02/27/2013   DIAGNOSES AT THE TIME OF DISCHARGE: 1.  Acute cerebrovascular accident with right occipital infarct.  2.  Atrial fibrillation.  3.  Recent gastrointestinal bleed.  4.  Valvular heart disease with aortic stenosis.  5.  Weakness.  6.  Visual disturbance.   HISTORY OF PRESENT ILLNESS: Samantha Kerr is a 81 year old female with a history of recent GI bleed for which she was admitted to the hospital and her Coumadin was held. She returns to the [facility] brought in by her daughter complaining of generalized weakness associated with confusion and also visual disturbance mainly in her left eye. The patient was subsequently seen in the Emergency Room and was admitted for possible stroke. CT scan showed evidence of a right occipital infarct.   PAST MEDICAL HISTORY: Significant for atrial fibrillation, recent rectal bleed, history of aortic stenosis, history of hypertension, glaucoma, diverticulosis, coronary artery disease and history of GERD.   PHYSICAL EXAMINATION:  GENERAL: She was confused, weak and had difficulty standing and walking and also complained of frontal headache and headache behind her eyes. She did have some visual disturbance with decreased vision especially of the left eye.  HEART: S1, S2. A 3 over 6 systolic murmur noted. Irregular rhythm.  ABDOMEN: Soft, nontender.  EXTREMITIES: No edema.  NEUROLOGIC: Evidence of drowsiness with weakness, especially in the left upper and lower extremities.   EKG showed evidence of atrial fibrillation, heart rate of 69 with left axis deviation, septal infarct with QS complexes in V1 and V2 and nonspecific ST-T changes also noted. An echocardiogram has shown evidence of LVH with elevated PA pressures. EF had been 55% to 60% done on 02/20/2013 with global normal left ventricular function, mild concentric left  ventricular hypertrophy, restrictive pattern of diastolic filling, severely dilated left as well as right atrium and moderate to severe mitral annular calcification, moderate to severe mitral regurgitation and moderate aortic valve stenosis with elevated PA pressures.   The patient was admitted to [facility], was seen by neurology as well as GI. She was started on Plavix. She was also given subcutaneous heparin. Her weakness gradually improved. Mental status also improved. Drowsiness improved. She was seen by physical therapy and it was felt that she would benefit from rehab. She did have a couple events of nonsustained VT. Her magnesium was noted to be low and was replaced. She was discharged in stable condition to a skilled nursing facility on the following medications.   DISCHARGE MEDICATIONS: Metoprolol tartrate 50 mg 1/2 tablet p.o. b.i.d,. Lumigan eye drops 0.1% 1 drop each eye once a day, dorzolamide 2% ophthalmic solution 1 drop in each affected eye 3 times a day, Clopidogrel 75 mg once a day, aspirin 81 mg a day, atorvastatin 40 mg once a day, magnesium oxide 400 mg 1 tablet p.o. b.i.d.  DIET: She was advised a low sodium diet, continued physical therapy and follow up with me, Dr. Ginette Pitman, in 1 to 2 weeks' time.   PLAN: To convert her back to her Coumadin as an outpatient in 2 to 4 weeks.   TOTAL TIME SPENT IN DISCHARGE OF THIS PATIENT: 35 minutes.  ____________________________ Tracie Harrier, MD vh:aw D: 02/27/2013 13:08:35 ET T: 02/27/2013 13:40:22 ET JOB#: [id number]  cc: Tracie Harrier, MD, <Dictator> Tracie Harrier MD ELECTRONICALLY SIGNED 03/03/2013 13:42

## 2014-09-03 NOTE — Consult Note (Signed)
PATIENT NAME:  Samantha Kerr, Samantha Kerr MR#:  [id number] DATE OF BIRTH:  03/24/1921  GASTROENTEROLOGY INPATIENT CONSULTATION   DATE OF CONSULTATION:  02/25/2013  REFERRING PHYSICIAN:  Theodoro Grist, MD CONSULTING PHYSICIAN:  Arther Dames, MD  REASON FOR THE CONSULT: Lower GI bleed.   HISTORY OF PRESENT ILLNESS: Samantha Kerr is a 81 year old female with a past medical history notable for atrial fibrillation, on Coumadin, severe aortic stenosis, who GI is consulted on for need for anticoagulation in setting of recent lower GI bleed. Of note, last week, Samantha Kerr was admitted to the hospital with diarrhea and rectal bleeding. She did have a drop in her hemoglobin; however, that stabilized. The decision was made to stop her Coumadin and not perform endoscopy given her age and advanced comorbidities.    She went home over the weekend and now is re-presenting with a CVA. GI is currently consulted for recommendations and possible procedures regarding restarting anticoagulation.   Currently, Samantha Kerr and her family deny any more rectal bleeding. They also report that the diarrhea has also resolved.   PAST MEDICAL HISTORY:  1. Atrial fibrillation.  2. Severe aortic stenosis.  3. Concentric left ventricular hypertrophy.  4. Dilated right atrium.  5. Hypertension.  6. Hyperlipidemia.  7. Glaucoma.  8. Diverticulosis.  9. Coronary disease.  10. GERD.   MEDICATIONS AT HOME: Lisinopril 20 mg daily, metoprolol 25 twice a day and eyedrops daily.   SOCIAL HISTORY: She denies any tobacco or alcohol. She is a former smoker.   FAMILY HISTORY: No family history of GI malignancy that she is aware of.   REVIEW OF SYSTEMS:   CONSTITUTIONAL: No weight gain or weight loss.  No fever or chills. HEENT: No oral lesions or sore throat. No vision changes. GASTROINTESTINAL: See HPI.  HEME/LYMPH: No easy bruising or bleeding. CARDIOVASCULAR: No chest pain or dyspnea on exertion. GENITOURINARY: No hematuria. INTEGUMENTARY: No  rashes or pruritus PSYCHIATRIC: No depression/anxiety.  ENDOCRINE: No heat/cold intolerance, no hair loss or skin changes. ALLERGIC/IMMUNOLOGIC: Negative for hives. RESPIRATORY: No cough, no shortness of breath.  MUSCULOSKELETAL: No joint swelling or muscle pain.  PHYSICAL EXAMINATION: VITAL SIGNS: Temperature is 98.1, pulse is 82, blood pressure 118/75, pulse oximetry is 93% on room air.  GENERAL: Elderly-appearing, chronically ill-appearing female. She is in no acute distress. HEENT: Normocephalic/atraumatic. Extraocular movements are intact. Anicteric. NECK: Soft, supple. JVP appears normal. No adenopathy. CHEST: Clear to auscultation. No wheeze or crackle. Respirations unlabored. CARDIOVASCULAR: A 4/6 systolic murmur. ABDOMEN: Soft, nontender, nondistended.  Normal active bowel sounds in all four quadrants.  No organomegaly. No masses EXTREMITIES: No swelling, well perfused. SKIN: No rash or lesion. Skin color, texture, turgor normal. NEUROLOGICAL: Grossly intact. PSYCHIATRIC: Normal tone and affect. MUSCULOSKELETAL: No joint swelling or erythema.   LABORATORY DATA: Sodium is 138, potassium is 3.5, chloride 106, bicarbonate 27, BUN 11, creatinine 0.86. Her liver enzymes are notable for an albumin of 2.9, otherwise normal. Cardiac enzymes are normal. White count 4.8, hemoglobin is 11, hematocrit is 32, platelets are 174. INR is 1.2 currently. Her echo Doppler shows an LVEF of 55% with severely dilated left atrium and severely dilated right atrium, moderate aortic valve stenosis.   ASSESSMENT AND PLAN:  1. Lower gastrointestinal bleed: It seems as though this lower gastrointestinal bleed also occurred in the setting of diarrhea. Both of these have resolved, per Samantha Kerr and her family. I suspect in Samantha Kerr diarrhea and rectal bleeding were most likely either from an acute infection or from  ischemic colitis.   Given that these are the most likely etiologies, I would not recommend  performing endoscopy at this time due to her advanced age and severe comorbidities. Instead, I would recommend going ahead and starting anticoagulation if it is clinically indicated. I am not concerned about the possibility of her developing a gastrointestinal bleed. I think the consequences of this would be less than the consequences of further injury to the brain.   If she were to develop further rectal bleeding, although I do not anticipate this, then at that time, we would reconsider endoscopy or stopping the anticoagulation. I would recommend considering starting a heparin drip while she is in the hospital just to ensure that she is not going to start bleeding. However, it would be reasonable to also just start her directly on Coumadin or Lovenox.   Thank you for this consult. Please contact me with any questions or concerns.   ____________________________ Arther Dames, MD mr:lb D: 02/25/2013 12:42:12 ET T: 02/25/2013 13:01:46 ET JOB#: [id number]  cc: Arther Dames, MD, <Dictator> Mellody Life MD ELECTRONICALLY SIGNED 03/12/2013 16:24

## 2014-09-03 NOTE — Consult Note (Signed)
CC: LGI bleed.  Pt hgb stable at 9.7, had 2 darkish old blood type stools today. No SOB, no abd pain, no chest pain.  Repeat CBC in morning.  Very much concern that no clinical pathway is going to avoid serious illness due to significant medical problems which all afffect each other.    Electronic Signatures: Manya Silvas (MD)  (Signed on 22-Oct-14 17:28)  Authored  Last Updated: 22-Oct-14 17:28 by Manya Silvas (MD)

## 2014-09-03 NOTE — Consult Note (Signed)
PATIENT NAME:  Samantha Kerr, Samantha Kerr MR#:  [id number] DATE OF BIRTH:  December 20, 1920  DATE OF CONSULTATION:  03/03/2013  CONSULTING PHYSICIAN:  Manya Silvas, MD  The patient is a 81 year old white female who had a recent GI bleed while on Coumadin, felt to be likely a lower GI bleed, probably diverticulosis. She has a history of diverticulosis. Her Coumadin was held. She went home, and then she had a stroke. She was readmitted with a stroke, which improved significantly, and she was placed on aspirin and Plavix. The patient was readmitted to the hospital because of 2 episodes of bright red blood per rectum today. I was asked to see her in consultation again.   PAST MEDICAL HISTORY: Atrial fibrillation, severe aortic stenosis, recent CVA, hypertension, hyperlipidemia, glaucoma, diverticulosis, coronary artery disease, history of GERD.   PAST SURGICAL HISTORY: Pacemaker, back surgery and hysterectomy.   ALLERGIES: SULFA, [place].   CURRENT MEDICATIONS:  Magnesium oxide 400 mg twice a day, metoprolol 50 mg 1/2 tablet twice a day, Plavix 75 mg a day, aspirin 81 mg a day, atorvastatin 40 mg a day, dorzolamide ophthalmic solution 2% 1 drop each eye 3 times a day, Lumigan 0.01% ophthalmic solution 1 drop each eye in the morning.   SOCIAL HISTORY: Quit smoking 30 years ago. Worked in [employer].   FAMILY HISTORY: Father had cancer of unknown cause. Mother had heart disease. Both died at age 33.   REVIEW OF SYSTEMS:  No chills or fever. No dysphagia. No severe chest pains. She does have some shortness of breath with exertion, but it is unchanged from before. No nausea or vomiting. No abdominal pain. No dysuria or hematuria. No fainting.   PHYSICAL EXAMINATION: VITAL SIGNS: Temp 97.8, pulse 68, respirations 18, blood pressure 172/80.  GENERAL:  White female, elderly, in no acute distress, is oriented to hospital and [place].  HEENT: Sclerae anicteric. Conjunctivae negative. Tongue is negative.   Head is atraumatic. NECK: Shows referred carotid bruits from aortic stenosis.  HEART: Shows 3 to 4/6 systolic murmur consistent with aortic stenosis.  ABDOMEN: Flat, nontender. No hepatosplenomegaly. No masses. No bruits.  RECTAL: Shows maroonish, dark blood.   LABS: White count 4.8, hemoglobin 11.2, platelet count 217. BUN 12, creatinine 0.96, sodium 135, potassium 4.2, chloride 103, CO2 of 29, calcium 8.8. Liver panel normal. She does have a pacemaker in.   ASSESSMENT:  Patient with painless bleeding again while on Plavix and aspirin to protect her from having a stroke from atrial fibrillation and aortic stenosis. The patient clearly does not tolerate anticoagulant medications; however, when she was off of these, she had a stroke. This is an extremely difficult management situation, especially given her severe aortic stenosis. I will follow with you.    ____________________________ Manya Silvas, MD rte:dmm D: 03/03/2013 19:12:08 ET T: 03/03/2013 19:48:15 ET JOB#: [id number]  cc: Manya Silvas, MD, <Dictator> Tracie Harrier, MD Manya Silvas MD ELECTRONICALLY SIGNED 03/08/2013 12:33

## 2014-09-03 NOTE — H&P (Signed)
PATIENT NAME:  Samantha Kerr, Samantha Kerr MR#:  [id number] DATE OF BIRTH:  11/22/1920  DATE OF ADMISSION:  02/24/2013  PRIMARY CARE PHYSICIAN: Tracie Harrier, MD  HISTORY OF PRESENT ILLNESS:  The patient is a 81 year old Caucasian female with past medical history significant for history of recent admission to the hospital and discharged 2 days ago for upper GI bleed during which the patient's Coumadin was stopped, who presents to the hospital with complaints of weakness as well as feeling confused and having problems with vision.  Apparently, the patient was doing well yesterday. She went to bed and today in the morning she was noted to be disoriented, confused and she was having issues with walking. The patient's family thought that she may have also left lower extremity weakness. She was taken to [facility] clinic and sent to the Emergency Room for further evaluation because of concerns of stroke. She was complaining of some pain behind her eyes earlier today. Apparently, she was having problems also seeing well in the morning, however, according to the patient's family, patient's daughter-in-law, the patient's vision seemed clear up while she was driving to the hospital. She was able to eat in the morning and had no aspiration symptoms, no dysphagia. She denies any numbness or weakness in her upper or lower extremities, however, she is having difficulty  walking straight as she has some balance problems. She also admits of having some chest pains earlier today, some tightness in the chest earlier today, admitted of having some bilateral frontal headaches. Past medical history is significant for history of admission for upper GI bleed which was felt to be of unclear source. No EGD was done at this time. The patient was advised to get Prilosec, however, she failed to do so; also history of hypertension during the same admission just a few days ago.   MEDICATIONS: Lisinopril 20 mg p.o. twice daily, metoprolol 25 mg twice daily,  Lumigan 0.05% once daily and dorzolamide 2% drops 3 times daily.   PAST MEDICAL HISTORY: Also significant for atrial fibrillation. The patient was on Coumadin, now taken off; severe aortic stenosis. Echocardiogram most recently done on 02/20/2013 which showed ejection fraction of 55% to 60%,  global normal left ventricular function, mild concentric LVH, restrictive pattern of diastolic filling, severely dilated left as well as right atrium, moderate to severe mitral annular calcification, moderate to severe marginal valve regurgitation as well as moderate aortic valve stenosis and elevated pulmonary arterial pressures. Also history significant for severe aortic stenosis as mentioned above during echocardiogram was performed just this admission, hypertension, hyperlipidemia, glaucoma, diverticulosis, coronary artery disease, history of gastroesophageal reflux disease.   ALLERGIES: SULFA.   SOCIAL HISTORY: The patient denies alcohol or drugs, and quit smoking 20 or 30 years ago.   FAMILY HISTORY: Hypertension, coronary artery disease.   REVIEW OF SYSTEMS:  CONSTITUTIONAL:  Positive for feeling weak, somnolent, confused, also having difficulty walking, some balance problems, frontal headache, headache behind her eyes, postnasal drip which seems to be chronic, some intermittent chest tightness earlier today in the morning, also dyspnea on exertion. She denies fevers or chills, pains, weight loss or gain.  EYES: Denies any blurry vision, double vision. Admits of glaucoma, no cataracts.  ENT: Denies any tinnitus, allergies, epistaxis, sinus tenderness or difficulty swallowing. Denies any cough, wheezes, asthma, COPD.   CARDIOVASCULAR: Denies any orthopnea, edema, arrhythmias, palpitations or syncope.  GASTROINTESTINAL: Denies any nausea, vomiting, diarrhea or constipation.  GENITOURINARY: Denies dysuria, hematuria, frequency or incontinence.  ENDOCRINOLOGY: Denies any  polydipsia, nocturia, thyroid  problems, heat or cold intolerance or thirst.  HEMATOLOGIC: Denies anemia, easy bruising or bleeding, or swollen glands.  SKIN: Denies any acne, rashes, change in moles.  MUSCULOSKELETAL: Denies arthritis, cramps, swelling, gout.  NEUROLOGIC: No numbness, epilepsy or tremor.  PSYCHIATRIC: Denies anxiety, insomnia.   PHYSICAL EXAMINATION: VITAL SIGNS: On arrival the patient's temperature was 98.4, pulse was 62, respirations were 18, blood pressure 138/65, saturation was 95% on room air.  GENERAL: This is a well-developed, well-nourished Caucasian female in no significant distress, laying on the stretcher.  Her eyes are closed and she has difficulty opening them. She is somewhat uncomfortable but no significant sensitivity to light, however, noted.  HEENT: Her pupils are equal and reactive to light. Extraocular movements intact. No icterus or conjunctivitis. Has normal hearing. No pharyngeal erythema. Mucosa is moist without any masses. Sinuses are nontender. No oral thrush. NECK:  No adenopathy. No JVD. The patient does have bilateral carotid bruits, full range of motion.  LUNGS:  Clear to auscultation in all fields.  (Dictation Anomaly) No rhonchi, rales, or wheezing.  No dullness to percussion or overt respiratory distress.  CARDIOVASCULAR: S1, S2 appreciated. Rhythm was irregularly irregular, a 4 out of 6 systolic murmur was heard in all cardiac fields, mostly in the aortic auscultation side radiating to the left axilla as well as bilateral carotid areas; 1+ pedal  pulses.  No lower extremity, edema, clubbing or cyanosis was noted.  ABDOMEN: Soft, nontender. Bowel sounds are present. No hepatosplenomegaly or masses were noted.  RECTAL: Deferred.  MUSCLE STRENGTH: Able to move all extremities. No cyanosis, degenerative joint disease or kyphosis. I tested her gait, she was unsteady but I am not able to see if, in fact, she has significant weakness in her upper or lower extremities. She was able to  walk. She is somewhat neglecting her left side and not able to see what is on the left side of her body.  SKIN: No skin rashes, lesions, erythema, nodularity or induration. Somewhat brownish discoloration was noted in lower extremities.  LYMPHATIC: No adenopathy in the cervical region.  NEUROLOGICAL: Cranial nerves grossly intact. Sensory is intact. The patient has no dysarthria or aphasia. The patient is somnolent intermittently, whenever woken up alert, was able to converse but then drifts back to sleep. She is poorly cooperative. Memory is impaired. It is difficult to say, I am not able to see significant confusion.   LABORATORY AND DIAGNOSTIC DATA:  The patient's EKG showed A. fib at rate of 69 beats per minute, left axis deviation with septal infarct noted on EKG with QS in V1, V2; nonspecific ST-T changes were noted but no acute ST-T changes were noted. As compared to prior EKG done in April 2014, at that time the patient very had similar changes and atrial fibrillation. The patient's echocardiogram most recently done on the 10th October this year showing aortic stenosis, mitral regurgitation and normal ejection fraction as well as LVH as well as elevated pulmonary arterial pressures. Patient's lab data done on 02/24/2013 were unremarkable. The patient's estimated GFR for non-African American was 51. The patient's albumin level was low at 3.3 on liver testing, otherwise liver tests were normal. Cardiac enzymes, first set negative. CBC: White blood cell count 6.3, hemoglobin 11.7, platelet count 201.   RADIOLOGIC STUDIES: Chest, PA and lateral, 02/24/2013 showed findings suggesting low-grade CHF, no focal pneumonia. CT scan of head without contrast showed right occipital area of infarction.   ASSESSMENT AND PLAN: 1.  Stroke. Admit the patient to the medical floor. Patient's stroke is concerning for embolic; however, cannot rule out thrombotic. The patient had echocardiogram done recently, will not  repeat; however, patient would benefit from transesophageal echocardiogram.  Unfortunately, will not be able to get her back on Coumadin yet since she had recent bleed. She would benefit from esophagogastroduodenoscopy to evaluate her area of bleed.  2.  Congestive heart failure, acute, diastolic. We will hold off IV fluids. We will stop also metoprolol and lisinopril at this time due to concerns of hypotension.  3.  Atrial fibrillation, rate controlled.  We may need to reinstitute her on metoprolol to control her heart rate, however, at this point we are not going to initiate her on lisinopril due to relative hypotension due to stroke. Will not be able to initiate also Coumadin therapy as of yet. The  patient would benefit from transesophageal echocardiogram.   4.  Hyperlipidemia. Check the patient's lipid panel and initiate the patient on lipid-lowering medications. Will be initiating her on Lipitor.  5.  History of glaucoma. Will continue Lumigan as well as dorzolamide.  6.  History of gastroesophageal reflux disease and recent gastrointestinal bleed. Will initiate the patient on proton pump inhibitors twice daily. The patient would benefit from gastroenterology consultation which we will be arranging for her.   TIME SPENT: 50 minutes on this patient.    ____________________________ Theodoro Grist, MD rv:cs D: 02/24/2013 14:50:50 ET T: 02/24/2013 15:33:46 ET JOB#: [id number]  cc: Theodoro Grist, MD, <Dictator> Tracie Harrier, MD Vijay Durflinger MD ELECTRONICALLY SIGNED 03/31/2013 19:18

## 2014-09-03 NOTE — Discharge Summary (Signed)
PATIENT NAME:  Samantha Kerr, AGAR MR#:  [id number] DATE OF BIRTH:  04-Dec-1920  DATE OF ADMISSION:  03/03/2013 DATE OF DISCHARGE:  03/08/2013  DIAGNOSES AT TIME OF DISCHARGE: 1. Lower gastrointestinal bleed with acute blood loss anemia, most likely secondary to diverticulitis.  2. Atrial fibrillation.  3. Valvular heart disease with severe aortic stenosis.  4. Hypertension.  5. Hyperlipidemia.  6. Glaucoma. 7. Gastroesophageal reflux disease.   CHIEF COMPLAINT: Bright red blood per rectum.   HISTORY OF PRESENT ILLNESS: Odelle Kosier is a 81 year old female with a history of atrial fibrillation, recent GI bleed while she was on Coumadin subsequently developed an occipital stroke and started on aspirin and Plavix and now presented to the Emergency Room again with two episodes of bright red blood per rectum. The patient has been experiencing a sense of fatigue and tiredness. No nausea or vomiting. She was hemodynamically stable, and hemoglobin was 11.2 on admission.   PAST MEDICAL HISTORY: Significant for atrial fibrillation, severe aortic stenosis, recent CVA and recent GI bleed, history of hypertension, hyperlipidemia, glaucoma, diverticulosis, coronary artery disease and GERD.   PAST SURGICAL HISTORY: Significant for hysterectomy, back surgery.   PHYSICAL EXAMINATION: VITAL SIGNS: Temperature was 97.8, pulse was 60, respirations 18, blood pressure 172/80, pulse oximetry 95% on room air. She was not in respiratory distress.  HEENT: [place]/AT.  NECK: No JVD.  LUNGS: Clear to auscultation.  HEART: S1, S2, 4/6 systolic ejection murmur noted.   ABDOMEN: Soft, nontender.  EXTREMITIES: Trace edema.  NEUROLOGIC: Cranial nerves were intact. Deep tendon reflexes were normal.   LABS/STUDIES: Troponin was negative. WBC count 4.8, hemoglobin 11.2, platelet count 217, hematocrit 32.5, glucose 100, BUN 12, creatinine 0.96, sodium 135, potassium 4.2, chloride 103, CO2 29, calcium 8.8. Liver function tests were  normal. During his stay in the hospital, the patient was seen in consultation by gastroenterologist Dr. Vira Agar. Her hemoglobin did drop to 8.9 and she was transfused 1 unit, afterwards hemoglobin picked up to 10.3. The bleeding gradually resolved, Dr. Vira Agar felt that she could be restarted back on aspirin but keep her off the Plavix and she remained hemodynamically stable and she was discharged in stable condition on the following medications.   DISCHARGE MEDICATIONS: Metoprolol tartrate 50 mg 1/2 tablet b.i.d. Lumigan one drop to each eye once a day, dorzolamide eye drops 1 drop t.i.d. in each eye, Mag-Ox 400 mg 1 tablet p.o. b.i.d., aspirin 81 mg a day. Omeprazole 20 mg once a day, atorvastatin 40 mg once a day.   FOLLOWUP: The patient has been advised to have a CBC rechecked in 1 to 2 weeks and to keep her follow-up appointment with me, Dr. Ginette Pitman and also follow up with Dr. Vira Agar in 1 to 2 weeks' time. The patient was stable at the time of discharge.   TOTAL TIME SPENT ON THIS DISCHARGE OF THE PATIENT: 35 minutes.  ____________________________ Tracie Harrier, MD vh:sg D: 03/23/2013 13:36:58 ET T: 03/23/2013 13:53:17 ET JOB#: [id number]  cc: Tracie Harrier, MD, <Dictator> Tracie Harrier MD ELECTRONICALLY SIGNED 04/01/2013 13:08

## 2014-09-04 NOTE — Consult Note (Signed)
Referring Physician:  Albertine Patricia   Primary Care Physician:  Albertine Patricia Bingen, [address], [place], [place] [address], [place] [phone]  Reason for Consult: Admit Date: 21-May-2013  Chief Complaint: gait problems  Reason for Consult: CVA   History of Present Illness: History of Present Illness:   81 yo RHD F presents to [facility] secondary to gait problems.  Pt has been having these over the past few months and had these before which was noted to be a stroke.  Pt does not have any dizziness, diplopia or focal weakness.  Pt does not want more of the exam done.  ROS:  General denies complaints   HEENT no complaints   Lungs no complaints   Cardiac no complaints   GI no complaints   GU no complaints   Musculoskeletal no complaints   Extremities no complaints   Skin no complaints   Neuro no complaints   Endocrine no complaints   Psych no complaints   Past Medical/Surgical Hx:  GERD:   CAD:   glaucoma:   hyperlipidemia:   hyperlipidemia:   severe aortic stenosis:   GI bleed:   Diverticulitis:   Stroke:   Glaucoma:   Atrial Fibrillation:   HTN:   left breast lumpectomy for cancer with radiation treatment.:   cardiac stents:   Back Surgery:   Hysterectomy - Total:   Past Medical/ Surgical Hx:  Past Medical History as above   Past Surgical History as above   Home Medications: Medication Instructions Last Modified Date/Time  aspirin 81 mg oral delayed release tablet 1 tab(s) orally once a day 08-Jan-15 19:44  atorvastatin 40 mg oral tablet 1 tab(s) orally once a day (at bedtime) 08-Jan-15 19:44  omeprazole 20 mg oral delayed release capsule 1 cap(s) orally once a day 08-Jan-15 19:44  Lumigan 0.01% ophthalmic solution 1 drop(s) in each eye once a day 08-Jan-15 19:44  dorzolamide ophthalmic 2% ophthalmic solution 1 drop(s) to each eye 3 times a day 08-Jan-15 19:44   Allergies:  Sulfa drugs:  Unknown  Betagan: Unknown  Alphagan: Unknown  Allergies:  Allergies NKDA   Social/Family History: Employment Status: retired  Lives With: alone  Living Arrangements: assisted living  Social History: no tob, no EtOH, no illicits  Family History: no strokes   Vital Signs: **Vital Signs.:   09-Jan-15 08:42  Vital Signs Type Pre Medication  Temperature Temperature (F) 98.1  Celsius 36.7  Temperature Source oral  Pulse Pulse 69  Respirations Respirations 18  Systolic BP Systolic BP 144  Diastolic BP (mmHg) Diastolic BP (mmHg) 84  Mean BP 100  Pulse Ox % Pulse Ox % 94  Pulse Ox Activity Level  At rest  Oxygen Delivery Room Air/ 21 %   Physical Exam: General: thin, elderly, NAD  HEENT: normocephalic, sclera nonicteric, oropharynx clear  Neck: supple, no JVD, no bruits  Chest: CTA B, no wheezing, good movement  Cardiac: RRR, no murmurs, no edema, 2+ pulses  Extremities: no C/C/E, FROM , R arm sling   Neurologic Exam: Mental Status: alert and oriented x 3, normal speech and language, follows complex commands  Cranial Nerves: PERRLA, EOMI, nl VF, face symmetric, tongue midline, shoulder shrug equal  Motor Exam: 5/5 B normal, tone, no tremor  Deep Tendon Reflexes: 2+/4 B, plantars downgoing B, no Hoffman  Sensory Exam: pinprick, temperature, and vibration intact B  Coordination: can not test gait   Lab Results: LabObservation:  09-Jan-15 08:51  OBSERVATION Reason for Test  Hepatic:  08-Jan-15 18:43   Bilirubin, Total 0.8  Alkaline Phosphatase 112 (45-117 NOTE: New Reference Range 04/03/13)  SGPT (ALT) 16  SGOT (AST) 17  Total Protein, Serum 7.7  Albumin, Serum 3.5  Routine Chem:  09-Jan-15 03:21   Cholesterol, Serum 100  Triglycerides, Serum 51  HDL (INHOUSE)  61  VLDL Cholesterol Calculated 10  LDL Cholesterol Calculated 29 (Result(s) reported on 22 May 2013 at 04:27AM.)  Glucose, Serum 88  BUN 11  Creatinine (comp) 0.87  Sodium, Serum 136  Potassium,  Serum  3.3  Chloride, Serum 103  CO2, Serum 25  Calcium (Total), Serum 8.9  Anion Gap 8  Osmolality (calc) 271  eGFR (African American) >60  eGFR (Non-African American)  59 (eGFR values <87mL/min/1.73 m2 may be an indication of chronic kidney disease (CKD). Calculated eGFR is useful in patients with stable renal function. The eGFR calculation will not be reliable in acutely ill patients when serum creatinine is changing rapidly. It is not useful in  patients on dialysis. The eGFR calculation may not be applicable to patients at the low and high extremes of body sizes, pregnant women, and vegetarians.)  Magnesium, Serum  1.7 (1.8-2.4 THERAPEUTIC RANGE: 4-7 mg/dL TOXIC: > 10 mg/dL  -----------------------)  Cardiac:  08-Jan-15 18:43   Troponin I < 0.02 (0.00-0.05 0.05 ng/mL or less: NEGATIVE  Repeat testing in 3-6 hrs  if clinically indicated. >0.05 ng/mL: POTENTIAL  MYOCARDIAL INJURY. Repeat  testing in 3-6 hrs if  clinically indicated. NOTE: An increase or decrease  of 30% or more on serial  testing suggests a  clinically important change)  Routine UA:  08-Jan-15 19:36   Color (UA) Yellow  Clarity (UA) Cloudy  Glucose (UA) Negative  Bilirubin (UA) Negative  Ketones (UA) Negative  Specific Gravity (UA) 1.012  Blood (UA) Negative  pH (UA) 7.0  Protein (UA) Negative  Nitrite (UA) Negative  Leukocyte Esterase (UA) Negative (Result(s) reported on 21 May 2013 at 07:58PM.)  RBC (UA) <1 /HPF  WBC (UA) <1 /HPF  Bacteria (UA) NONE SEEN  Epithelial Cells (UA) <1 /HPF  Amorphous Crystal (UA) PRESENT (Result(s) reported on 21 May 2013 at 07:58PM.)  Routine Coag:  08-Jan-15 18:43   Prothrombin 14.6  INR 1.2 (INR reference interval applies to patients on anticoagulant therapy. A single INR therapeutic range for coumarins is not optimal for all indications; however, the suggested range for most indications is 2.0 - 3.0. Exceptions to the INR Reference Range may include:  Prosthetic heart valves, acute myocardial infarction, prevention of myocardial infarction, and combinations of aspirin and anticoagulant. The need for a higher or lower target INR must be assessed individually. Reference: The Pharmacology and Management of the Vitamin K  antagonists: the seventh ACCP Conference on Antithrombotic and Thrombolytic Therapy. YHCWC.3762 Sept:126 (3suppl): [id number]. A HCT value >55% may artifactually increase the PT.  In one study,  the increase was an average of 25%. Reference:  "Effect on Routine and Special Coagulation Testing Values of Citrate Anticoagulant Adjustment in Patients with High HCT Values." American Journal of Clinical Pathology 2006;126:400-405.)  Routine Hem:  09-Jan-15 03:21   WBC (CBC) 4.7  RBC (CBC) 4.01  Hemoglobin (CBC)  10.4  Hematocrit (CBC)  31.5  Platelet Count (CBC) 187  MCV  78  MCH  25.8  MCHC 32.9  RDW  19.2  Neutrophil % 61.7  Lymphocyte % 16.7  Monocyte % 13.5  Eosinophil % 6.1  Basophil % 2.0  Neutrophil #  2.9  Lymphocyte #  0.8  Monocyte # 0.6  Eosinophil # 0.3  Basophil # 0.1 (Result(s) reported on 22 May 2013 at 03:57AM.)   Radiology Results: [place]:    09-Jan-15 13:49, US Carotid Doppler Bilateral  US Carotid Doppler Bilateral   REASON FOR EXAM:    CVA  COMMENTS:       PROCEDURE: [place]  - US CAROTID DOPPLER BILATERAL  - May 22 2013  1:49PM     CLINICAL DATA:  Hypertension, stroke symptoms    EXAM:  BILATERAL CAROTID DUPLEX ULTRASOUND    TECHNIQUE:  Wallace Cullens scale imaging, color Doppler and duplex ultrasound were  performed of bilateral carotid and vertebral arteries in the neck.    COMPARISON:  05/22/2013 brain MRI  FINDINGS:  Criteria: Quantification of carotid stenosis is based on velocity  parameters that correlate the residual internal carotid diameter  with NASCET-based stenosis levels, using the diameter of the distal  internal carotid lumen as the denominator for stenosis measurement.    The  following velocity measurements were obtained:    RIGHT    ICA:  78/22 cm/sec    CCA:  68/10 cm/sec    SYSTOLIC ICA/CCA RATIO:  1.2  DIASTOLIC ICA/CCA RATIO:  2.1    ECA:  111 cm/sec    LEFT    ICA:  87/20 cm/sec    CCA:  69/13 cm/sec    SYSTOLIC ICA/CCA RATIO:  1.3    DIASTOLIC ICA/CCA RATIO:  1.5    ECA:  70 cm/sec  RIGHT CAROTID ARTERY: Minor echogenic shadowing plaque formation. No  hemodynamically significant right ICA stenosis, velocity elevation,  or turbulent flow.    RIGHT VERTEBRAL ARTERY:  Antegrade    LEFT CAROTID ARTERY: Similar scattered minor echogenic plaque  formation. No hemodynamically significant left ICA stenosis,  velocity elevation, or turbulent flow.    LEFT VERTEBRAL ARTERY:  Antegrade     IMPRESSION:  Minor carotid atherosclerosis. No hemodynamically significant ICA  stenosis. Degree of narrowing less than 50% bilaterally  Electronically Signed    By: Ruel Favors M.D.    On: 05/22/2013 14:09         Verified By: Sigurd Sos, M.D.,  MRI:    09-Jan-15 13:02, MRI Brain Without Contrast  MRI Brain Without Contrast   REASON FOR EXAM:    CVA  COMMENTS:       PROCEDURE: MR  - MR BRAIN WO CONTRAST  - May 22 2013  1:02PM     CLINICAL DATA:  Dizziness.  Stroke.    EXAM:  MRI HEAD WITHOUT CONTRAST    TECHNIQUE:  Multiplanar, multiecho pulse sequences of the brain and surrounding  structures were obtained without intravenous contrast.    COMPARISON:  CT 05/21/2013  FINDINGS:  Generalized atrophy. Chronic right PCA infarct involving the right  occipital lobe and right thalamus. Mild chronic microvascular  ischemic change in the white matter. Small chronic infarct left  cerebellum.    Negative for acute infarct.    Chronic hemorrhage in the right cerebellum. Negative for fluid  collection. Small calcified meningioma left frontal lobe, as noted  on the CT.     IMPRESSION:  Atrophy and chronic ischemic changes.  Chronic  right PCA infarct.  No acute infarct.      Electronically Signed    By: Marlan Palau M.D.    On: 05/22/2013 13:13         Verified By: Camelia Phenes, M.D.,  CT:  08-Jan-15 19:12, CT Head Without Contrast  CT Head Without Contrast   REASON FOR EXAM:    AMS  COMMENTS:   May transport without cardiac monitor    PROCEDURE: CT  - CT HEAD WITHOUT CONTRAST  - May 21 2013  7:12PM     CLINICAL DATA:  New onset weakness around 1700 hr.    EXAM:  CT HEAD WITHOUT CONTRAST    TECHNIQUE:  Contiguous axial images were obtained from the base of the skull  through the vertex without intravenous contrast.    COMPARISON:  CT of the head February 24, 2013.  FINDINGS:  The ventricles and sulci are normal for age. Evolving right mesial  occipitallobe infarction, with very mild ex vacuo dilatation of  right ventricle atrium. No intraparenchymal hemorrhage, mass effect  nor midline shift. Patchy supratentorial white matter hypodensities  are less than expected for patient's age and though non-specific  suggest sequelae of chronic small vessel ischemic disease. No acute  large vascular territory infarcts.    No abnormal extra-axial fluid collections. 6 x 6 mm bony excrescence  from the inner table of the left frontal calvarium likely reflects a  meningioma. Basal cisterns are patent. Moderate calcific  atherosclerosis of the carotid siphons and to lesser extent included  vertebral artery's.  No skull fracture. Visualized paranasal sinuses and mastoid aircells  are well-aerated. The included ocular globes and orbital contents  are non-suspicious.     IMPRESSION:  No acute intracranial process.    Chronic right posterior cerebral artery territory infarct.    Involutional changes. Mild white matter changes suggest chronic  small vesselischemic disease, less than expected for age.      Electronically Signed    By: Elon Alas    On: 05/21/2013 19:24         Verified By:  Ricky Ala, M.D.,   Radiology Impression: Radiology Impression: MRI personally reviewed by me and shows an old R PCA infarct only   Impression/Recommendations: Recommendations:   labs reviewed by me notes reviewed by me  Gait problems-  nonspecific but chronic, there is no evidence of new stroke; vast differential Old R PCA infarct- stable, most likely cardioembolic continue ASA and statin needs full gait assessment as an outpatient once she recovers from fall needs PT assessment will sign off, please have pt f/u with Neurology in 2-3 months  Electronic Signatures: Jamison Neighbor (MD)  (Signed 09-Jan-15 14:25)  Authored: REFERRING PHYSICIAN, Primary Care Physician, Consult, History of Present Illness, Review of Systems, PAST MEDICAL/SURGICAL HISTORY, HOME MEDICATIONS, ALLERGIES, Social/Family History, NURSING VITAL SIGNS, Physical Exam-, LAB RESULTS, RADIOLOGY RESULTS, Recommendations   Last Updated: 09-Jan-15 14:25 by Jamison Neighbor (MD)

## 2014-09-04 NOTE — H&P (Signed)
PATIENT NAME:  Samantha Kerr, INSALACO MR#:  [id number] DATE OF BIRTH:  May 06, 1921  DATE OF ADMISSION:  05/21/2013  ATTENDING PHYSICIAN: Shirlyn Goltz, MD   PRIMARY CARE PHYSICIAN: Geralyn Corwin, MD   CHIEF COMPLAINT: Unsteady gait, generalized weakness.   HISTORY OF PRESENT ILLNESS: This is a 81 year old female with significant past medical history of AFib, aortic stenosis, history of recent CVA, hyperlipidemia, hypertension. The patient is known to have history of AFib but not on anticoagulation currently due to GI bleed in March.   The patient presents with complaints of sudden onset of unsteady gait, generalized weakness and dizziness. The patient reports she has been at her normal state of health till she had this presentation this afternoon. The patient's daughter-in-law at bedside gives most of the history. She reports these are typical presentations for her last CVA in October of this year.   The patient does  have history of AFib but she is not anymore on warfarin and due to history of GI bleed. There is no episode of loss of consciousness or confusion or altered mental status. The patient remained at her baseline mental status.   The patient had CT head without contrast which did not show any acute findings. It only did show evidence of her old CVA which is chronic right posterior cerebral artery territory infarct. The patient denies any dysphagia or dysarthria or feeling any focal deficits or tingling or numbness, but she just feels out of balance.   The patient's blood work did not show any significant abnormalities beside known chronic anemia. Hospitalist service was requested to admit the patient for further management and work-up. The daughter-in-law reports the patient has right humerus fracture status post fall last month.   PAST MEDICAL HISTORY:  1.  History of Afib.   2.  Aortic stenosis.  3.  Recent CVA.  4.  GI bleed.  5.  Hypertension.  6.  Hyperlipidemia.  7.  Glaucoma.  8.   Diverticulosis.  9.  Coronary artery disease.  10.  History of gastroesophageal reflux disease.   PAST SURGICAL HISTORY:  1.  Hysterectomy.  2.  Back surgery.  3.  Pacemaker.   ALLERGIES:  1.  ALPHAGAN.  2.  BETAGAN  3.  SULFA.   HOME MEDICATIONS:  1.  Aspirin 81 mg oral daily.  2.  Atorvastatin 40 mg oral at bedtime.  3.  Dorzolamide 2% ophthalmic solution, both eyes, 2 times a day.  4.  Lumigan 0.01% each eye.   SOCIAL HISTORY: The patient currently lives at home. Son lives with her at home currently to help take care of her. No alcohol. Quit smoking 30 years ago.   FAMILY HISTORY: Significant for coronary artery disease in her mother.   REVIEW OF SYSTEMS: CONSTITUTIONAL: Denies fever, chills, fatigue, weight gain, weight loss.  EYES: Blind in the right eye and having left peripheral loss of visual field due her to recent CVA.  ENT: Hard of hearing. Denies any sore throat and any nausea, any dysphagia.  CARDIOVASCULAR: Denies any chest pain, palpitation, syncope.  RESPIRATORY: Denies cough, wheezing, hemoptysis, dyspnea.  GASTROINTESTINAL: Denies nausea, vomiting, diarrhea, abdominal pain, hematemesis, melena.  GENITOURINARY: Denies dysuria, hematuria, or colic.  ENDOCRINE: Denies polyuria, polydipsia, heat or cold intolerance.  HEMATOLOGY: Denies anemia, easy bruising, bleeding diathesis.  INTEGUMENTARY: Denies acne, rash or skin lesions.  MUSCULOSKELETAL: Denies any swelling, gout, arthritis or cramps.  NEUROLOGIC: Reports unsteady gait, dizziness, and recent history of CVA. Denies any headache, altered mental  status, ataxia, dysarthria, dementia.  PSYCHIATRIC: Denies anxiety, insomnia, bipolar disorder.   PHYSICAL EXAMINATION:  VITAL SIGNS: Temperature 98, pulse 69, respiratory rate 18, blood pressure 170/79, saturating 97% on room air.  GENERAL: Well-nourished female who looks comfortable in bed, in no apparent distress.  HEENT: Head atraumatic, normocephalic. Pupils  equal, reactive to light. Pink conjunctivae, anicteric, sclerae. The patient has loss of visual field in the left eye. 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 Dry oral mucosa.  NECK: Supple. No thyromegaly. No JVD.  CHEST: Good air entry bilaterally. No wheezing, rales, rhonchi.  CARDIOVASCULAR: S1, S2 heard. No rubs or gallops. Has significant systolic murmur.  ABDOMEN: Soft, nontender, nondistended. Bowel sounds present.  EXTREMITIES: No edema. No clubbing. No cyanosis. Pedal pulses felt bilaterally.  PSYCHIATRIC: Appropriate affect. Awake, alert x3. Intact judgment and insight.  NEUROLOGIC: No facial droop. Cranial nerves are grossly intact beside her peripheral visual loss on the left eye. Motor: All extremities five out of five. Exam not done on the right arm as she is wearing sling. The patient tried to stand up but she would not walk due to unsteady gait. Could not even perform Romberg test on her. Has mild dysmetria in the left upper extremity.  MUSCULOSKELETAL: The patient has fractured her right humerus. The patient is wearing a sling on the right arm due to her humerus fracture.  SKIN: Normal skin turgor. Warm and dry.  LYMPHATICS: No cervical lymphadenopathy could be appreciated.   PERTINENT LABORATORY DATA:  1.  Glucose 104, BUN 12, creatinine 0.8, sodium 137, potassium 3.7, chloride 103, CO2 29. ALT 16, AST 17, alk phos 112, total bilirubin 0.8, albumin 3.5.  2.  White blood cells 5.1, hemoglobin 10.7, hematocrit 32.3, platelets 227.  3.  Urinalysis negative for leuckocyte eastrase and nitrite.   IMAGING STUDIES: CT head without contrast showing no acute intracranial process. Chronic right posterior cerebral artery territory infarct.   ASSESSMENT AND PLAN:  1.  Unsteady gait, dizziness, weakness. These symptoms are highly suspicious for cerebrovascular accident given the fact this is the presentation during her last cerebrovascular accident. The patient will be given 325 mg of aspirin. Will be  started on 325 mg aspirin daily. She is on statin. We will check a MRI of the brain. Will check carotid Doppler and will check 2-D echocardiogram and will consult neurology service. The patient is known to have history of atrial fibrillation. Given her history of recent gastrointestinal bleed, and fear of conversion to hemorrhagic cerebrovascular accident, will not start her on any full-dose anticoagulation currently.  2.  History of atrial fibrillation, currently rate controlled. Continue with aspirin.  3.  History of hypertension. The patient is not taking any blood pressure medicine at home. Anyhow will not start her on anything even though her blood pressure is uncontrolled as would allow for permissive hypertension.  4.  Hyperlipidemia. Continue with statin.  5.  Recent history of cerebrovascular accident, continue with aspirin.  6.  History of gastrointestinal bleed. Continue with Protonix twice a day.  7.  History of glaucoma. Continue with home eyedrops.  8.  History of gastroesophageal reflux disease. Continue with proton pump inhibitor.   9.  Deep vein thrombosis prophylaxis. Subcutaneous heparin.   CODE STATUS: THE PATIENT REPORTS SHE HAS A LIVING WILL. HER SON IS HER HEALTHCARE POWER OF ATTORNEY, AND SHE WISHES TO BE DNR.   Daughter-in-law is at bedside.   TOTAL TIME SPENT ON ADMISSION AND PATIENT CARE: 55 minutes.   ____________________________ Silver Huguenin  Gunnison Chahal, MD dse:np D: 05/21/2013 20:35:00 ET T: 05/21/2013 21:00:36 ET JOB#: [id number]  cc: Albertine Patricia, MD, <Dictator> Aneya Daddona Graciela Husbands MD ELECTRONICALLY SIGNED 05/29/2013 0:15

## 2014-09-04 NOTE — H&P (Signed)
PATIENT NAME:  Samantha Kerr, Samantha Kerr MR#:  [id number] DATE OF BIRTH:  [birth year]  DATE OF ADMISSION:  11/17/2013  REFERRING PHYSICIAN: Dr. Jacqualine Code.   PRIMARY CARE PHYSICIAN: Dr. Ginette Pitman of [facility].   CARDIOLOGIST: Dr. Saralyn Pilar, [facility].   CHIEF COMPLAINT: Chest pain.   HISTORY OF PRESENT ILLNESS: This is a 81 year old Caucasian female with past medical history of aortic stenosis, mitral regurgitation, atrial fibrillation off anticoagulation, hypertension, hyperlipidemia, presenting with chest pain. She describes acute onset chest pain, retrosternal in location, pressure in quality, 4 out of 10 in intensity, nonradiating. No worsening or relieving factors. No associated symptoms. States her chest pain is still present, however, around 1 out of 10 in intensity. Otherwise, no further complaints.    REVIEW OF SYSTEMS:  CONSTITUTIONAL: Denies fevers, chills, fatigue.  EYES: Denies blurred vision, double vision, eye pain.  EARS, NOSE, THROAT: Denies tinnitus, ear pain, hearing loss.  RESPIRATORY: Denies cough, wheeze, shortness of breath.  CARDIOVASCULAR: Positive for chest pain as described above; however, denies palpitations, edema or orthopnea.  GASTROINTESTINAL: Denies nausea, vomiting, diarrhea, abdominal pain.  GENITOURINARY: Denies dysuria, hematuria.  ENDOCRINE: Denies nocturia or thyroid problems.  HEMATOLOGIC AND LYMPHATIC: Denies easy bruising, bleeding.  SKIN: Denies rash or lesions.  MUSCULOSKELETAL: Denies pain in neck, back, shoulder, knees, hips or arthritic symptoms.  NEUROLOGIC: Denies paralysis, paresthesias.  PSYCHIATRIC: Denies anxiety or depressive symptoms.  Otherwise, full review of systems performed by me is negative.   PAST MEDICAL HISTORY: Aortic stenosis, mitral regurgitation, atrial fibrillation, hypertension, congestive heart failure diastolic, diastolic, hyperlipidemia, coronary artery disease status post PCI and stent placement, gastroesophageal reflux  disease.   SOCIAL HISTORY: Remote tobacco abuse, no alcohol or drug use.   FAMILY HISTORY: Positive for coronary artery disease.   ALLERGIES: ALPHAGAN, BETAGAN AS WELL AS SULFA DRUGS.   HOME MEDICATIONS: Include aspirin 81 mg p.o. daily, calcium carbonate 1 tab p.o. daily, diltiazem 120 mg p.o. daily, Lipitor 40 mg p.o. daily, Lasix 20 mg p.o. daily, Feosol 325 mg 2 tabs p.o. daily, dorzolamide 2% ophthalmic solution one drop to left eye 3 times daily, Lumigan 0.01% ophthalmic solution one drop to both eyes daily, Prilosec 20 mg p.o. daily.   PHYSICAL EXAMINATION: VITAL SIGNS: Temperature 98.2, heart rate 75, respirations 18, blood pressure 155/88, saturating 95% on room air. Weight 74.8 kg, BMI 24.4.  GENERAL: Well-nourished, well-developed, Caucasian female, currently in no acute distress.  HEAD: Normocephalic, atraumatic.  EYES: Pupils are equal, round, and reactive to light. Extraocular muscles intact. No scleral icterus.  MOUTH: Moist mucous membranes. Dentition intact. No abscess noted.  EARS, NOSE, AND THROAT: Clear, without exudates. No external lesions.  NECK: Supple. No thyromegaly. No nodules. No JVD.  PULMONARY: Clear to auscultation bilaterally, without wheezes, rubs or rhonchi. No use of accessory muscles. Good respiratory effort.  CHEST: Nontender to palpation.  CARDIOVASCULAR: S1, S2. Irregular rate, irregular rhythm, with a 3/6 systolic ejection murmur, best heard at the right upper sternal border. No edema. Pedal pulses 2+ bilaterally.  GASTROINTESTINAL: Soft, nontender, nondistended. No masses. Positive bowel sounds. No hepatosplenomegaly.  MUSCULOSKELETAL: No swelling, clubbing, or edema. Range of motion full in all extremities.  NEUROLOGIC: Cranial nerves II through XII intact. No gross focal neurological deficits. Sensation intact. Reflexes intact.  SKIN: No ulceration, lesions, rash or cyanosis. Skin warm, dry. Turgor intact.  PSYCHIATRIC: Mood and affect within normal  limits. The patient is awake, alert, oriented x3. Insight and judgment intact.   LABORATORY DATA: EKG performed, revealing atrial fibrillation  with lateral T inversions. Remainder of laboratory data: Sodium 136, potassium 4.2, chloride 100, bicarbonate 30, BUN 15, creatinine 0.93, glucose 105. Troponin I less than 0.02. WBC 6.8, hemoglobin 13.2, platelets of 162.   ASSESSMENT AND PLAN: A 81 year old Caucasian female with a history of aortic stenosis, mitral regurgitation, coronary artery disease as well as atrial fibrillation, presenting with chest pain.  1.  Chest pain. Admit to telemetry under observational status. Trend cardiac enzymes x3. Initiate aspirin and statin therapy.  2.  Atrial fibrillation, off anticoagulation. Continue Cardizem for rate control.  3.  Hyperlipidemia. Continue statin therapy.  4.  Gastroesophageal reflux disease. Continue proton pump inhibitor therapy.  5.  Deep venous thrombosis prophylaxis with heparin subcutaneous.  CODE STATUS: The patient is full code.   TIME SPENT: 45 minutes.   ____________________________ Aaron Mose. Hower, MD dkh:cg D: 11/17/2013 23:23:42 ET T: 11/18/2013 00:25:46 ET JOB#: [id number]  cc: Aaron Mose. Hower, MD, <Dictator> DAVID Woodfin Ganja MD ELECTRONICALLY SIGNED 11/18/2013 3:26

## 2014-09-04 NOTE — Discharge Summary (Signed)
PATIENT NAME:  Samantha Kerr, Samantha Kerr MR#:  [id number] DATE OF BIRTH:  07-Aug-1920  DATE OF ADMISSION:  05/21/2013 DATE OF DISCHARGE:  05/26/2012  DISCHARGE DIAGNOSIS: 1.  Unsteady gait with dizziness and weakness concerning for cerebrovascular accident.  2.  History of atrial fibrillation.  3.  History of valvular heart disease.  4.  History of previous cerebrovascular accident. 5.  History of gastrointestinal bleed. 6.  History of glaucoma. 7.  History of gastroesophageal reflux disease.  CHIEF COMPLAINT: Unsteady gait, generalized weakness.   HISTORY OF PRESENT ILLNESS: Samantha Kerr is a 81 year old female with past medical history significant for aortic stenosis, atrial fibrillation, previous CVA, hyperlipidemia, and hypertension. The patient has a history of GI bleed, which has prevented her from being anticoagulated. She now presents with unsteady gait, generalized weakness, and dizziness. The patient states that she had a similar presentation during her previous CVA.  PAST MEDICAL HISTORY: Significant for aortic stenosis, atrial fibrillation, previous CVA, GI bleed, hypertension, hyperlipidemia, glaucoma, diverticulosis, CAD, and GERD.   PHYSICAL EXAMINATION: VITAL SIGNS: Temperature was 98, pulse was 69, respirations 18, blood pressure 170/79, and O2 sat 97% on room air.  GENERAL: She was not in distress.  HEENT: Force/AT. Pupils equal and reactive to light.  NECK: Supple. No JVD.  LUNGS: Clear to auscultation.  HEART: S1 and S2. A 3/6 systolic ejection murmur noted.  ABDOMEN: Soft, nontender.  EXTREMITIES: No edema.  NEUROLOGIC: Cranial nerves were intact. She had difficulty walking because of unsteadiness. The patient had evidence of fractured right humerus with right arm in a sling.   LABORATORY AND DIAGNOSTICS: Glucose 104, BUN 12, creatinine 0.8, sodium 137, potassium 3.7, chloride 103, CO2 29, ALT 16, AST 17, alk phos 112, total bili 0.8, albumin 3.5. WBC count 5.1, hemoglobin 10.7,  hematocrit 32.3, platelets 227.   CT of the head without contrast did not show any acute intracranial process. There is chronic right posterior cerebral artery territory infarct.   HOSPITAL COURSE: The patient was admitted to [facility] and continued on aspirin. She was also seen in consultation by neurologist and was also seen by physical therapy. Neurologist, Dr. Tamala Julian, felt that aspirin and statin needed to be continued and felt that the gait problems with nonspecific and chronic and did not show any evidence for a new stroke. The patient was also noted to be in A-fib with intermittent episodes of rapid heart rate and she was started on plain Cardizem 30 q. 8 and subsequently switched to Cardizem CD 120 mg q. 24. She was discharged in stable condition to rehab on the following medications.   DISCHARGE MEDICATIONS: Omeprazole 20 mg once a day, atorvastatin 40 mg once a day, aspirin 81 mg a day, Lumigan eye drops 0.01% one drop in each eye once a day, dorzolamide ophthalmic drops 2% one drop in each eye t.i.d., and Cardizem CD 120 mg 1 capsule once a day.   DISCHARGE INSTRUCTIONS: The patient is advised low sodium diet and follow with me, Dr. Ginette Pitman, in 1 to 2 weeks' time. The patient was stable at the time of discharge.  TOTAL TIME SPENT ON DISCHARGE: 35 minutes.  ____________________________ Tracie Harrier, MD vh:sb D: 05/26/2013 08:26:02 ET T: 05/26/2013 08:37:39 ET JOB#: [id number]  cc: Tracie Harrier, MD, <Dictator> Tracie Harrier MD ELECTRONICALLY SIGNED 06/11/2013 13:15

## 2014-09-04 NOTE — Consult Note (Signed)
PATIENT NAME:  Samantha Kerr, Samantha Kerr MR#:  [id number] DATE OF BIRTH:  [birth year]  DATE OF CONSULTATION:  11/24/2013  REFERRING PHYSICIAN:   CONSULTING PHYSICIAN:  Corey Skains, MD  REASON FOR CONSULTATION: Unstable angina with aortic valve stenosis and atrial fibrillation with preventricular contractions.   CHIEF COMPLAINT: "I got chest pain."   HISTORY OF PRESENT ILLNESS: A 81 year old female with critical aortic valve stenosis and mild coronary artery disease with atrial fibrillation and irregular heart rate with preventricular contractions having significant amount of chest discomfort repeated from an admission she had several days prior. This chest discomfort is substernal, radiating in her back, causing shortness of breath and is relieved by nitroglycerin and oxygen. The patient now is more comfortable at this time and no evidence of myocardial infarction with a normal troponin and an EKG showing atrial fibrillation with very frequent preventricular contractions.    REVIEW OF SYSTEMS:  Remainder of review of systems negative for vision change, ringing in the ears, hearing loss, cough, congestion, heartburn, nausea, vomiting, diarrhea, bloody stools, stomach pain, extremity pain, leg weakness, cramping in the buttocks, known blood clots, headaches, blackouts, dizzy spells, nosebleeds, congestion, trouble swallowing, frequent urination, urination at night, muscle weakness, numbness, anxiety, depression, skin lesions or skin rashes.   PAST MEDICAL HISTORY:  1.  Severe aortic stenosis.  2.  Angina.  3.  Hypertension.  4.  Coronary artery disease.   FAMILY HISTORY: No family members with early-onset cardiovascular disease or hypertension.   SOCIAL HISTORY: Currently denies alcohol or tobacco use.   ALLERGIES: AS LISTED.   MEDICATIONS: As listed.   PHYSICAL EXAMINATION:  VITAL SIGNS: Blood pressure is 146/68 bilaterally, heart rate is 70 upright, reclining, and regular.  GENERAL: She is a  well-appearing female in no acute distress.  HEENT: No icterus, thyromegaly, ulcers, hemorrhage, or xanthelasma.  CARDIOVASCULAR: Slightly irregular with normal S1, soft S2 with a 3/6 right upper sternal border, murmur radiating throughout. PMI is inferiorly displaced. Carotid upstroke normal with murmur radiation. Jugular venous pressure normal.  LUNGS: A few basilar crackles with normal respirations.  ABDOMEN: Soft, nontender without hepatosplenomegaly or masses. Abdominal aorta is normal size without bruit.  EXTREMITIES: Two plus radial, femoral, dorsal pedal pulses with no lower extremity edema, cyanosis, clubbing, or ulcers.  NEUROLOGIC: She is oriented to time, place, and person with normal mood and affect.   ASSESSMENT: A 81 year old female with severe aortic stenosis, unstable angina without evidence of acute myocardial infarction, and irregular heartbeat.  RECOMMENDATIONS:  1.  Indicates isosorbide 60 mg each day for better hypertension and anginal control.  2.  Avoid sublingual nitroglycerin if able due to concerns of severe aortic stenosis.  3.  Further discussion of the treatment of aortic valve stenosis including TAVR if necessary.  4.  Begin ambulation and adjustments of medications if necessary to improve patient as above.    ____________________________ Corey Skains, MD bjk:ts D: 11/24/2013 10:37:51 ET T: 11/24/2013 11:07:51 ET JOB#: [id number]  cc: Corey Skains, MD, <Dictator> Corey Skains MD ELECTRONICALLY SIGNED 11/27/2013 8:20

## 2014-09-04 NOTE — Consult Note (Signed)
PATIENT NAME:  Samantha Kerr, MCELVAIN MR#:  [id number] DATE OF BIRTH:  [birth year]  DATE OF CONSULTATION:  11/18/2013  REFERRING PHYSICIAN:   CONSULTING PHYSICIAN:  Isaias Cowman, MD  PRIMARY CARE PHYSICIAN:  Dr. Ginette Pitman.    CARDIOLOGIST:  Isaias Cowman, M.D.   CHIEF COMPLAINT: Chest pain.   HISTORY OF PRESENT ILLNESS: The patient is a 81 year old female with history of aortic stenosis and chronic atrial fibrillation. The patient has had a history of intermittent episodes of chest discomfort. She was recently prescribed isosorbide mononitrate; however, the patient has not taken the medication yet. She presented to [facility] last night with 4/10 chest discomfort, which is better today. The patient has ruled out for myocardial infarction by CPK, isoenzymes, and troponin.   PAST MEDICAL HISTORY:  1.  Aortic stenosis.  2.  Chronic atrial fibrillation.  3.  Hypertension.  4.  History of diastolic congestive heart failure.  5.  Known coronary artery disease, status post PCI and coronary stent.   MEDICATIONS: Aspirin 81 mg daily, diltiazem 120 mg daily, Lipitor 40 mg daily, Lasix 20 mg daily, calcium carbonate 1 tab daily, Feosol 325 mg 2 tabs daily, dorzolamide 2% ophthalmic solution 1 drop left eye t.i.d., Lumigan 0.01% ophthalmic solution both eyes daily, Prilosec 20 mg daily.   SOCIAL HISTORY: The patient currently lives alone, but she has family members who visit her multiple times a day.   FAMILY HISTORY: Positive for coronary artery disease.   REVIEW OF SYSTEMS:  CONSTITUTIONAL: No fever or chills.  EYES: No blurry vision.  EARS: No hearing loss.  RESPIRATORY: No shortness of breath.  CARDIOVASCULAR: Chest discomfort as described above.  GASTROINTESTINAL: No nausea, vomiting, or diarrhea.  GENITOURINARY: No dysuria or hematuria.  ENDOCRINE: No polyuria or polydipsia.  MUSCULOSKELETAL: No arthralgias or myalgias.  NEUROLOGICAL: No focal muscle weakness or numbness.   PSYCHOLOGICAL: No depression or anxiety.   PHYSICAL EXAMINATION:  VITAL SIGNS: Blood pressure 131/76, pulse 77, respirations 18, temperature 98.6, pulse oximetry 96%.  HEENT: Pupils equal and reactive to light and accommodation.  NECK: Supple without thyromegaly.  LUNGS: Clear.  HEART: Normal JVP. Normal PMI. Regular rate and rhythm. Normal S1, S2. Grade 3/6 to 4/6 harsh crescendo decrescendo murmur.  ABDOMEN: Soft and nontender. Pulses were intact bilaterally.  MUSCULOSKELETAL: Normal muscle tone.  NEUROLOGIC: The patient is alert and oriented x 3. Motor and sensory both grossly intact.   IMPRESSION: A 81 year old female with known coronary artery disease, status post prior percutaneous coronary intervention, known aortic stenosis, chronic atrial fibrillation with intermittent episodes of chest discomfort has ruled out for myocardial infarction by CPK, isoenzymes, and troponin. The patient is reluctant to pursue invasive cardiac evaluation or potential aortic valve replacement surgery.   RECOMMENDATIONS:  1.  Agree with overall current therapy.  2.  Would defer full dose anticoagulation.  3.  We will add low dose long-acting nitrates.  4.  No further cardiac diagnostics at this time.   ____________________________ Isaias Cowman, MD ap:ts D: 11/18/2013 12:41:24 ET T: 11/18/2013 13:12:15 ET JOB#: [id number]  cc: Isaias Cowman, MD, <Dictator> Isaias Cowman MD ELECTRONICALLY SIGNED 12/22/2013 13:54

## 2014-09-04 NOTE — H&P (Signed)
PATIENT NAME:  Samantha Kerr, Samantha Kerr MR#:  [id number] DATE OF BIRTH:  [birth year]  DATE OF ADMISSION:  05/31/2013  CHIEF COMPLAINT: Shortness of breath.   HISTORY OF PRESENT ILLNESS: Samantha Kerr is a 81 year old female who was recently discharged from [facility] and was at [facility] rehab center when she was noted to be short of breath last night. The patient, according to family was also having a dry cough. However, this morning was noted to be more dyspneic at rest and was sent to ER for further evaluation.   PAST MEDICAL HISTORY: Significant for valvular heart disease with aortic valve stenosis and mitral regurgitation, history of atrial fibrillation, a history of hypertension, congestive heart failure, hyperlipidemia, and coronary artery disease. The patient most recently had a fracture of the right humerus. She was also recently admitted to [facility] with unsteady gait and dizziness and weakness concerning for cerebrovascular accident. At that time she was noted to be in rapid atrial fibrillation and started on Cardizem. The patient also is not a candidate for Coumadin because of previous GI bleed.   PAST SURGICAL HISTORY: Significant for breast cancer with lumpectomy and radiation therapy, history of back surgery with ruptured disk and history of bare metal stent to the LAD in 10/2009.   REVIEW OF SYSTEMS: The patient denies any fevers, chills, nausea, vomiting. No diarrhea. No chest pain. No ankle edema. No headaches or dizziness.   CURRENT MEDICATIONS: Aspirin 81 mg a day, Cardizem CD 120 mg 1 capsule a day, Lipitor 40 mg once a day, Lumigan eyedrops 0.01% ophthalmic solution one drop in each eye once a day, dorzolamide 2% ophthalmic solution one drop to each eye 3 times a day.   ALLERGIES: SULFA.   PHYSICAL EXAMINATION:  GENERAL: The patient was dyspneic at rest.   VITAL SIGNS: Temperature was 97.6, pulse 91, respirations 22, blood pressure 145/59, pulse oximetry 91% on room air.  HEENT: /AT.  LUNGS:  The patient was noted to be in mild respiratory distress with wheezes and rhonchi and use of accessory muscles of respiration HEART: S1, S2, regular rhythm, 3/6 systolic ejection murmur noted.  ABDOMEN: Soft, nontender.  EXTREMITIES: No edema.  NEUROLOGIC: Alert and oriented x 3. No obvious focal neurological deficits.   LABS/STUDIES: Glucose 109, BNP was 1554, BUN 12, creatinine 0.86, sodium 134, potassium 3.6, chloride 102, CO2 25. Troponin 0.03. WBC count 5.3, hemoglobin 10.6, hematocrit 32.8, platelets 181.   Chest x-ray showed evidence of vascular congestion and cardiomegaly with increased interstitial markings concerning for pulmonary edema and mild bibasilar atelectasis and small bilateral pleural effusions. EKG showed atrial fibrillation with PVCs, left axis deviation and ST-T changes in the lateral leads with Q waves in septal leads.   IMPRESSION: 1. Acute shortness of breath concerning for pulmonary edema.  2. Atrial fibrillation.  3. History of valvular heart disease.  4. History of previous cerebrovascular accident.  5. History of previous gastrointestinal bleed.   PLAN: Admit patient to [facility]. We will treat her with low-dose diuretics and in view of her significant aortic valve disease continue medications for heart rate control including Cardizem CD and start her on low-dose ACE inhibitor i.e. lisinopril 2.5 mg once a day. Discussed the plan with the patient and family, who are all in agreement.   ____________________________ Tracie Harrier, MD vh:sg D: 05/31/2013 09:29:10 ET T: 05/31/2013 09:56:08 ET JOB#: [id number]  cc: Tracie Harrier, MD, <Dictator> Tracie Harrier MD ELECTRONICALLY SIGNED 06/23/2013 13:18

## 2014-09-04 NOTE — Discharge Summary (Signed)
PATIENT NAME:  Samantha, Kerr MR#:  [id number] DATE OF BIRTH:  Oct 18, 1920  DATE OF ADMISSION:  11/23/2013 DATE OF DISCHARGE:  11/26/2013  DISCHARGE DIAGNOSES:  1.  Chest pain secondary to unstable angina and severe aortic stenosis.  2.  Chronic atrial fibrillation.  3.  Hyperlipidemia.  4.  Gastroesophageal reflux disease.   CHIEF COMPLAINT: Chest pain.   HISTORY OF PRESENT ILLNESS: Samantha Kerr is a 81 year old female with a history of aortic stenosis, mitral regurgitation, A-fib, hypertension and hyperlipidemia who presents to the ER complaining of retrosternal chest pain, which she describes as 6/10 in intensity. Pain was not radiating. The patient was seen in the Emergency Room and started on IV nitro. She had taken 6 sublingual nitros at home but continued to be symptomatic. She describes the pain as a pressure-like feeling, and she also received morphine for pain relief. Past medical history was significant for aortic stenosis, mitral regurgitation, chronic A-fib, congestive heart failure, hyperlipidemia, CAD and GERD. A recent echo had shown an ejection fraction of 30% to 35%. Please see H and P for other details.   HOSPITAL COURSE: The patient was admitted initially to the CCU  on IV nitro. She was seen in consultation by cardiologist, Dr. Nehemiah Massed. The patient gradually improved. She was weaned off the nitro drip and transferred to the floor. She was started on Imdur 30 mg a day and that was subsequently increased to 60 mg a day. The patient's A-fib was rate controlled on Cardizem. During her stay in the hospital, she also received physical therapy. Discussions were held with her about possibly having TAVR for aortic stenosis, but the patient declined the procedure. She was seen by physical therapy who recommended home health PT upon discharge for home safety assessments and balance training.  However , pt developed further episodes of chest pain and needed Nitro s/l and Morphine IV. I discussed  her case with family including her son and daughter. Pt is now willing to go to [facility] for consideration for TAVR   DISCHARGE MEDICATIONS: Ferrous sulfate 325 mg once a day, isosorbide mononitrate 60 mg once a day, nitroglycerin 0.4 mg sublingual p.r.n., calcium carbonate 1 tablet once a day, furosemide 20 mg once a day, omeprazole 20 mg once a day, Lipitor 40 mg a day, aspirin 81 mg a day, diltiazem 120 mg once a day, dorzolamide ophthalmic 2% ophthalmic solution 1 drop to left eye 3 times a day for glaucoma, Lumigan 0.01% ophthalmic solution 1 drop in both eyes daily for glaucoma.   DISCHARGE INSTRUCTIONS: The patient was advised to call back with any questions or concerns and home health nursing was also arranged for her. The patient was discharged in stable condition. She will follow up with me, Dr. Ginette Pitman, and also follow up with Dr. Saralyn Pilar, cardiologist, in 1 to 2 weeks' time.   Total time spent in discharge of this pt; 35 minutes  ____________________________ Tracie Harrier, MD vh:sb D: 11/26/2013 08:28:39 ET T: 11/26/2013 09:16:42 ET JOB#: [id number]  cc: Tracie Harrier, MD, <Dictator> Tracie Harrier MD ELECTRONICALLY SIGNED 12/07/2013 18:15

## 2014-09-04 NOTE — Discharge Summary (Signed)
PATIENT NAME:  Samantha Kerr, Samantha Kerr MR#:  [id number] DATE OF BIRTH:  [birth year]  DATE OF ADMISSION:  11/23/2013 DATE OF DISCHARGE:  11/26/2013  HOSPITAL COURSE:  The patient reports recurrent chest pain and needed sublingual nitroglycerin as well as IV morphine for pain relief. Discussed her case with Dr. Nehemiah Massed, cardiologist, and also discussed once again with the family, as well as the patient about further intervention, including aortic valve replacement.  Family, at this point in time, is willing to pursue option of TAVR. The patient was therefore transferred to [facility] for evaluation and possible aortic valve replacement. She was transferred to [facility] on the following medications: Ferrous sulfate 225 mg once a day, furosemide 20 mg once a day, omeprazole 20 mg once a day, Lipitor 40 mg a day, aspirin 81 mg a day, diltiazem 120 mg a day, Lumigan ophthalmic drops 0.1% drop both eyes for glaucoma and dorzolamide ophthalmic 2% ophthalmic solution instill one drop in left eye 3 times a day for glaucoma, and Imdur 60 mg a day. The patient is stable at time of transfer.  Total time spent in transfer of this patient: 35 minutes  ____________________________ Tracie Harrier, MD vh:ts D: 11/26/2013 17:33:44 ET T: 11/26/2013 18:34:04 ET JOB#: [id number]  cc: Tracie Harrier, MD, <Dictator> Tracie Harrier MD ELECTRONICALLY SIGNED 12/07/2013 18:15

## 2014-09-04 NOTE — Discharge Summary (Signed)
PATIENT NAME:  Samantha Kerr, Samantha Kerr MR#:  [id number] DATE OF BIRTH:  [birth year]  DATE OF ADMISSION:  05/31/2013 DATE OF DISCHARGE:  06/03/2013   DIAGNOSES AT THE TIME OF DISCHARGE:  1. Acute shortness of breath secondary to valvular heart disease and pulmonary edema.  2. Atrial fibrillation.  3. History of aortic valve disease.  4. History of previous cerebrovascular accident.  5. Anemia.  6. History of previous gastrointestinal bleed.   CHIEF COMPLAINT: Shortness of breath.   HISTORY OF PRESENT ILLNESS: Samantha Kerr is a 81 year old female who was admitted through the ED from [place] after she was noted to be short of breath. The patient had been recently discharged from [facility] following an episode of atrial fibrillation and weakness. She also complained of a dry cough.   PAST MEDICAL HISTORY: Significant for aortic valve disease, mitral regurgitation, history of atrial fibrillation, history of hypertension, CHF, hyperlipidemia, CAD. She recently had sustained a fracture of the right humerus and also had a history of previous GI bleed from Coumadin and had to be taken off anticoagulation for this reason.   PAST SURGICAL HISTORY: Significant for breast cancer with lumpectomy, radiation therapy, history of back surgery, history of ruptured disk and bare-metal stent to the LAD in 2011.   PHYSICAL EXAMINATION:  VITAL SIGNS: Temperature was 97.6, pulse was 91, respirations 22, blood pressure 145/59, pulse oximetry was 91% on room air.  HEENT: NCAT.  RESPIRATIONS: She was noted to be in mild respiratory distress with wheezes and rhonchi and use of accessory muscles of respiration.  HEART: S1, S2. A 3/6 systolic ejection murmur noted.  ABDOMEN: Soft, nontender.  EXTREMITIES: No edema.  NEUROLOGIC: Nonfocal.   LABORATORY DATA: Glucose 109, BUN 12, creatinine 0.86, sodium 134, potassium 3.6. BNP was 1554. Chloride 102, CO2 25. Troponin 0.03. WBC count 5.3, hemoglobin 10.6, hematocrit 32.8 and  platelets 181.   CHEST X-RAY: Showed evidence of vascular congestion and cardiomegaly with increased interstitial markings and mild bibasilar atelectasis and small bilateral pleural effusions.   ELECTROCARDIOGRAM: Showed atrial fibrillation with PVCs, left axis deviation and ST-T changes in the lateral leads and Q waves in septal leads.   HOSPITAL COURSE: The patient was admitted to [facility] and placed on intravenous Lasix. She was also started on low-dose lisinopril. She was continued on her other medications including Cardizem and Lipitor. During her stay in hospital, the patient did have an episode of nosebleeds from her allergies, which subsequently resolved. Her shortness of breath also improved, and she was stable to be discharged back to rehab. The patient wished to be do not resuscitate status. She was discharged in stable condition on the following medications.   DISCHARGE MEDICATIONS:  1. Furosemide 20 mg once a day.  2. Fluticasone nasal spray 2 sprays once a day.  3. Lisinopril 2.5 mg once a day.  4. Omeprazole 20 mg once a day. 5. Lipitor 40 mg once a day.  6. Aspirin 81 mg a day.  7. Diltiazem CD 120 mg once a day.  8. Triamcinolone topical 0.1% topical cream applied to labia majora 12 hours as needed for skin irritation. 9. Lumigan eyedrops 0.01% 1 drop in both eyes once a day. 10. Dorzolamide ophthalmic 2% drops instill 1 drop in the left eye 3 times a day for glaucoma.   DISCHARGE INSTRUCTIONS: The patient has been advised to have a metabolic panel checked in 1 to 2 weeks and also advised low sodium diet and follow up with me, Dr. Ginette Pitman, in  2 weeks. The patient is stable at the time of discharge.   TOTAL TIME SPENT IN DISCHARGING THE PATIENT: 35 minutes    ____________________________ Samantha Harrier, MD vh:lb D: 06/03/2013 08:28:01 ET T: 06/03/2013 08:41:16 ET JOB#: [id number]  cc: Samantha Harrier, MD, <Dictator> Samantha Harrier MD ELECTRONICALLY SIGNED 06/23/2013  13:17

## 2014-09-04 NOTE — H&P (Signed)
PATIENT NAME:  Samantha Kerr, Samantha Kerr MR#:  [id number] DATE OF BIRTH:  Jan 02, 1921  DATE OF ADMISSION:  11/23/2013  PRIMARY CARE PHYSICIAN:  Dr. Ginette Pitman.  CARDIOLOGIST:  Dr. Saralyn Pilar.  CHIEF COMPLAINT: Chest pain.   Samantha Kerr is a 81 year old Caucasian female with past medical history of aortic stenosis, mitral regurgitation, Afib and history of hypertension and hyperlipidemia, comes to the Emergency Room with complaints of retrosternal chest pain, 6 out of 10 in intensity, nonradiating, no relieving or worsening factors. She continued to have chest pain despite 6 sublingual nitro, hence ER physician started patient on nitro drip. She still continues to have "chest pressure," which she is 6/10. I have ordered some morphine for her.   PAST MEDICAL HISTORY: 1.  Aortic stenosis.  2.  Mitral regurgitation.  3.  Chronic Afib.  4.  Congestive heart failure, diastolic.  5.  Hyperlipidemia.  6.  CAD status post PCI and stent placement in the past.  7.  GERD.   SOCIAL HISTORY: Remote tobacco abuse. No alcohol or drug use.   FAMILY HISTORY: Positive for CAD.   ALLERGIES: ALPHAGAN, BETAGAN, AS WELL AS SULFA DRUGS.   HOME MEDICATIONS: 1.  Omeprazole 20 mg p.o. daily.  2.  Nitroglycerin 0.4 mg sublingual as needed.  3.  Lumigan 0.01% 1 drop in both eyes once day.  4.  Lipitor 40 mg daily.  5.  Imdur (isosorbide mononitrate) 30 mg extended release p.o. daily.  6.  Lasix 20 mg daily.  7.  Ferrous sulfate 325 mg 2 tablets daily.  8.  Dorzolamide ophthalmic solution 2%, instill 1 drop in left eye 3 times a day for glaucoma.  9.  Diltiazem 120 mg p.o. daily.  10.  Calcium carbonate 1 p.o. daily.  11.  Aspirin 81 mg daily.     REVIEW OF SYSTEMS:   CONSTITUTIONAL:  No fever, fatigue, weakness.  EYES: No blurred or double vision. Positive for glaucoma. No cataracts.  EARS, NOSE, THROAT:  No tinnitus, ear pain, hearing loss, postnasal drip.  CARDIOVASCULAR: Positive for chest pain, aortic stenosis and chronic  atrial fibrillation.   RESPIRATORY: No cough, wheeze, hemoptysis or COPD.  GASTROINTESTINAL:  No nausea, vomiting, diarrhea, abdominal pain. No GERD.  GENITOURINARY: No dysuria, hematuria or frequency.  ENDOCRINE: No polyuria, nocturia or thyroid problems.  HEMATOLOGY: No anemia or easy bruising or bleeding disorder.  MUSCULOSKELETAL: Positive for arthritis. No swelling or gout.  NEUROLOGIC:  No CVA or TIA.  PSYCHIATRIC:  No anxiety, depression or bipolar disorder.  SKIN: No acne, rash or lesion. All other systems reviewed and negative.   PHYSICAL EXAMINATION: GENERAL: The patient is awake, alert, oriented x 3, not in acute distress.  VITAL SIGNS: She is afebrile. Pulse is 71, irregularly irregular. Blood pressure is 121/68. Sats are 97% on room air.  HEENT: Atraumatic, normocephalic. PERRLA. EOM intact. Oral mucosa is moist.  NECK: Supple. No JVD. No carotid bruit.  LUNGS: Clear to auscultation bilaterally. No rales, rhonchi, respiratory distress or labored breathing.  HEART: Both the heart sounds are normal. Rhythm is a irregularly irregular. There is a systolic murmur present over the aortic area, which radiates up to the carotids and up to the mitral area. PMI not lateralized. Chest nontender.  EXTREMITIES: Good pedal pulses, good femoral pulses. No lower extremity edema.  ABDOMEN: Soft, benign, nontender. No organomegaly. Positive bowel sounds.  NEUROLOGIC: Grossly intact cranial nerves II through XII. No motor or sensory deficits.  PSYCHIATRIC: The patient is awake, alert, oriented x 3.  SKIN: Warm and dry.   EKG shows chronic Afib.  CHEST X-RAY:  Stable appearance of the chest with moderate cardiomegaly, faint interstitial accumulation in both lungs. Troponin 0.02. Cardiac enzymes rest are normal. CBC within normal limits. Comprehensive metabolic panel within normal limits.   ASSESSMENT AND PLAN: A 81 year old Samantha Kerr with history of aortic stenosis, chronic atrial fib, comes  in with:  1.  Chest pain/unstable angina. The patient will be admitted on the CCU since she is on nitro drip. I will give p.r.n. morphine. Continue aspirin and statin. She is on diltiazem, which we will continue. Her chest pain likely is due to a tight aortic stenosis. We will her cardiac enzymes x 3. Dr. Nehemiah Massed will see the patient in consultation. Further recommendations per cardiology.  2.  Chronic atrial fib, off anticoagulation. Continue Cardizem for rate control and on baby aspirin.  3.  Hyperlipidemia, continue statins.  4.  Gastroesophageal reflux disease, continue proton pump inhibitor.  5.  Deep vein thrombosis prophylaxis, subQ heparin.   The above was discussed with the patient and the patient's daughter-in-law. The patient is a NO CODE, DNR. This was confirmed with the patient's daughter-in-law.   TIME SPENT:  50 minutes    ____________________________ [name], MD sap:dmm D: 11/23/2013 20:58:35 ET T: 11/23/2013 21:26:22 ET JOB#: [id number]  cc: Kylil Swopes A. Posey Pronto, MD, <Dictator> Ilda Basset MD ELECTRONICALLY SIGNED 12/08/2013 15:37

## 2014-10-15 IMAGING — CR DG CHEST 1V
1 series · 2 of 2 positions shown · non-contrast
Comparison: none

REASON FOR EXAM: anemia. GI bleed
COMMENTS:

[Series 1: pa · 0.17mm/px · 2 of 2 slices shown]
[im 1/2]
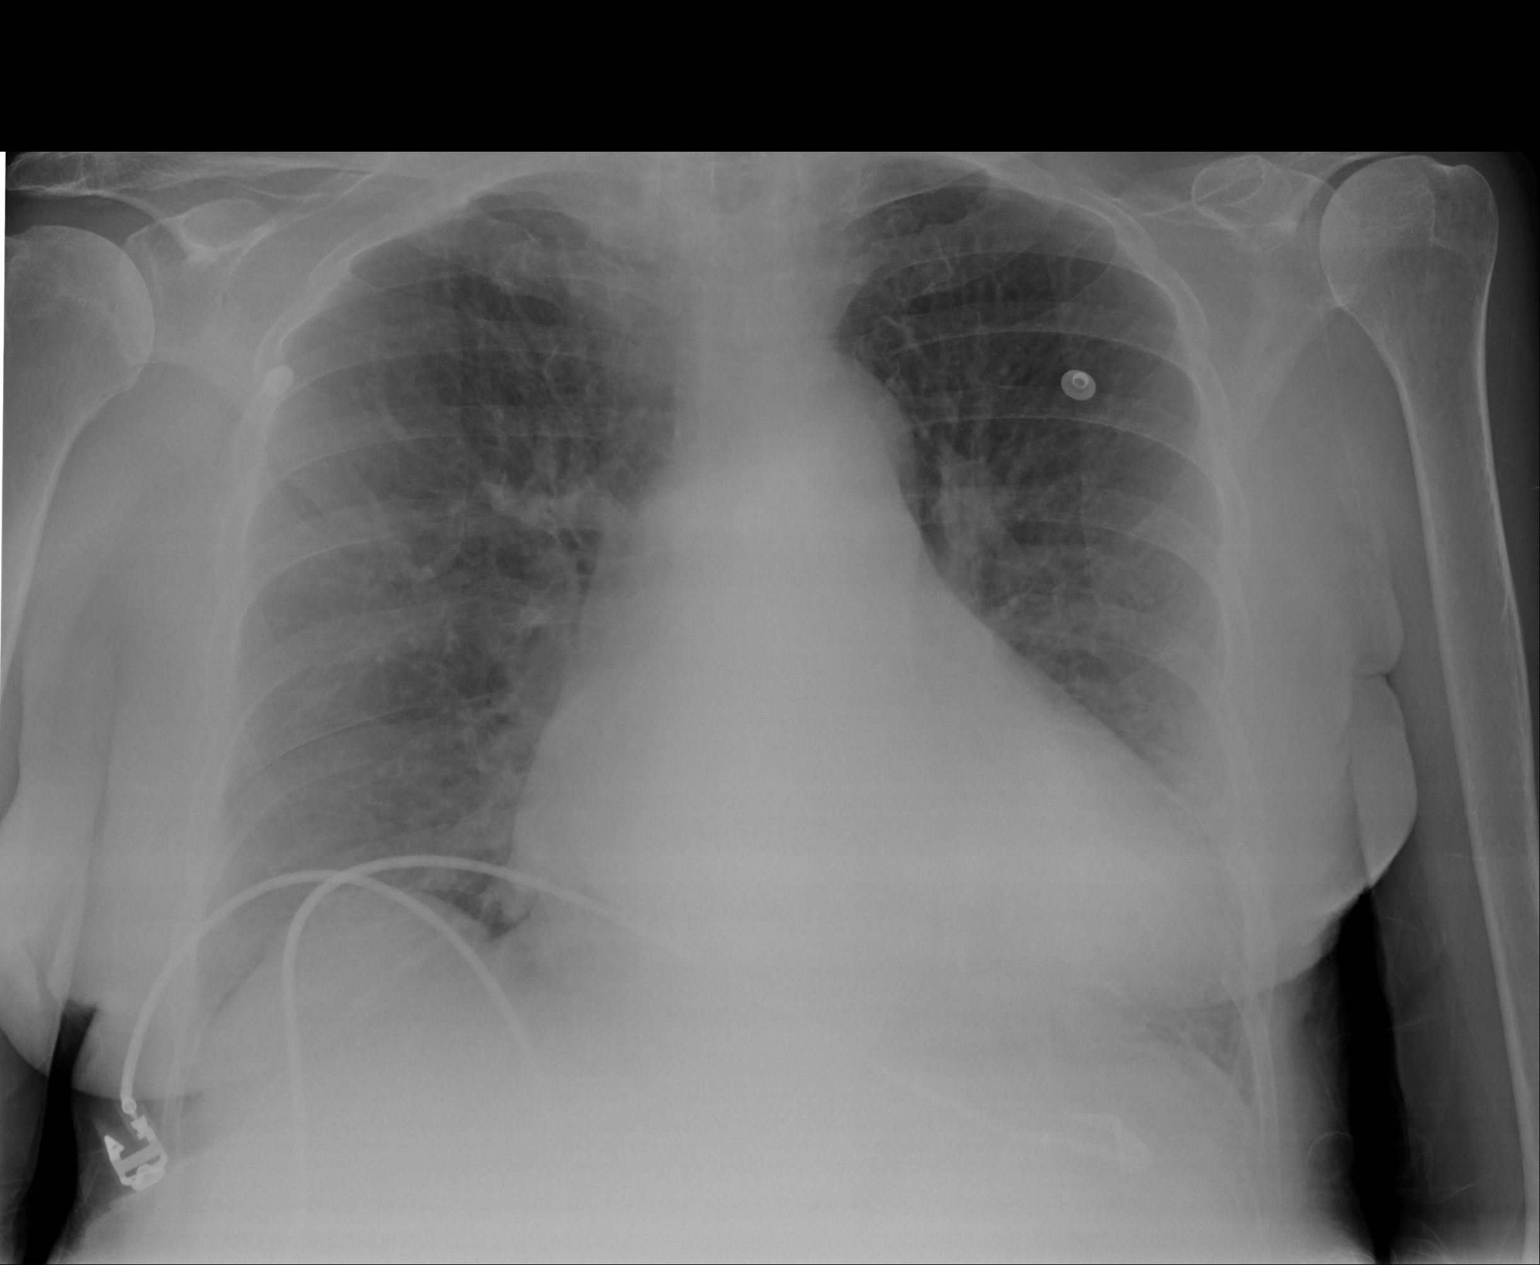
[im 2/2]
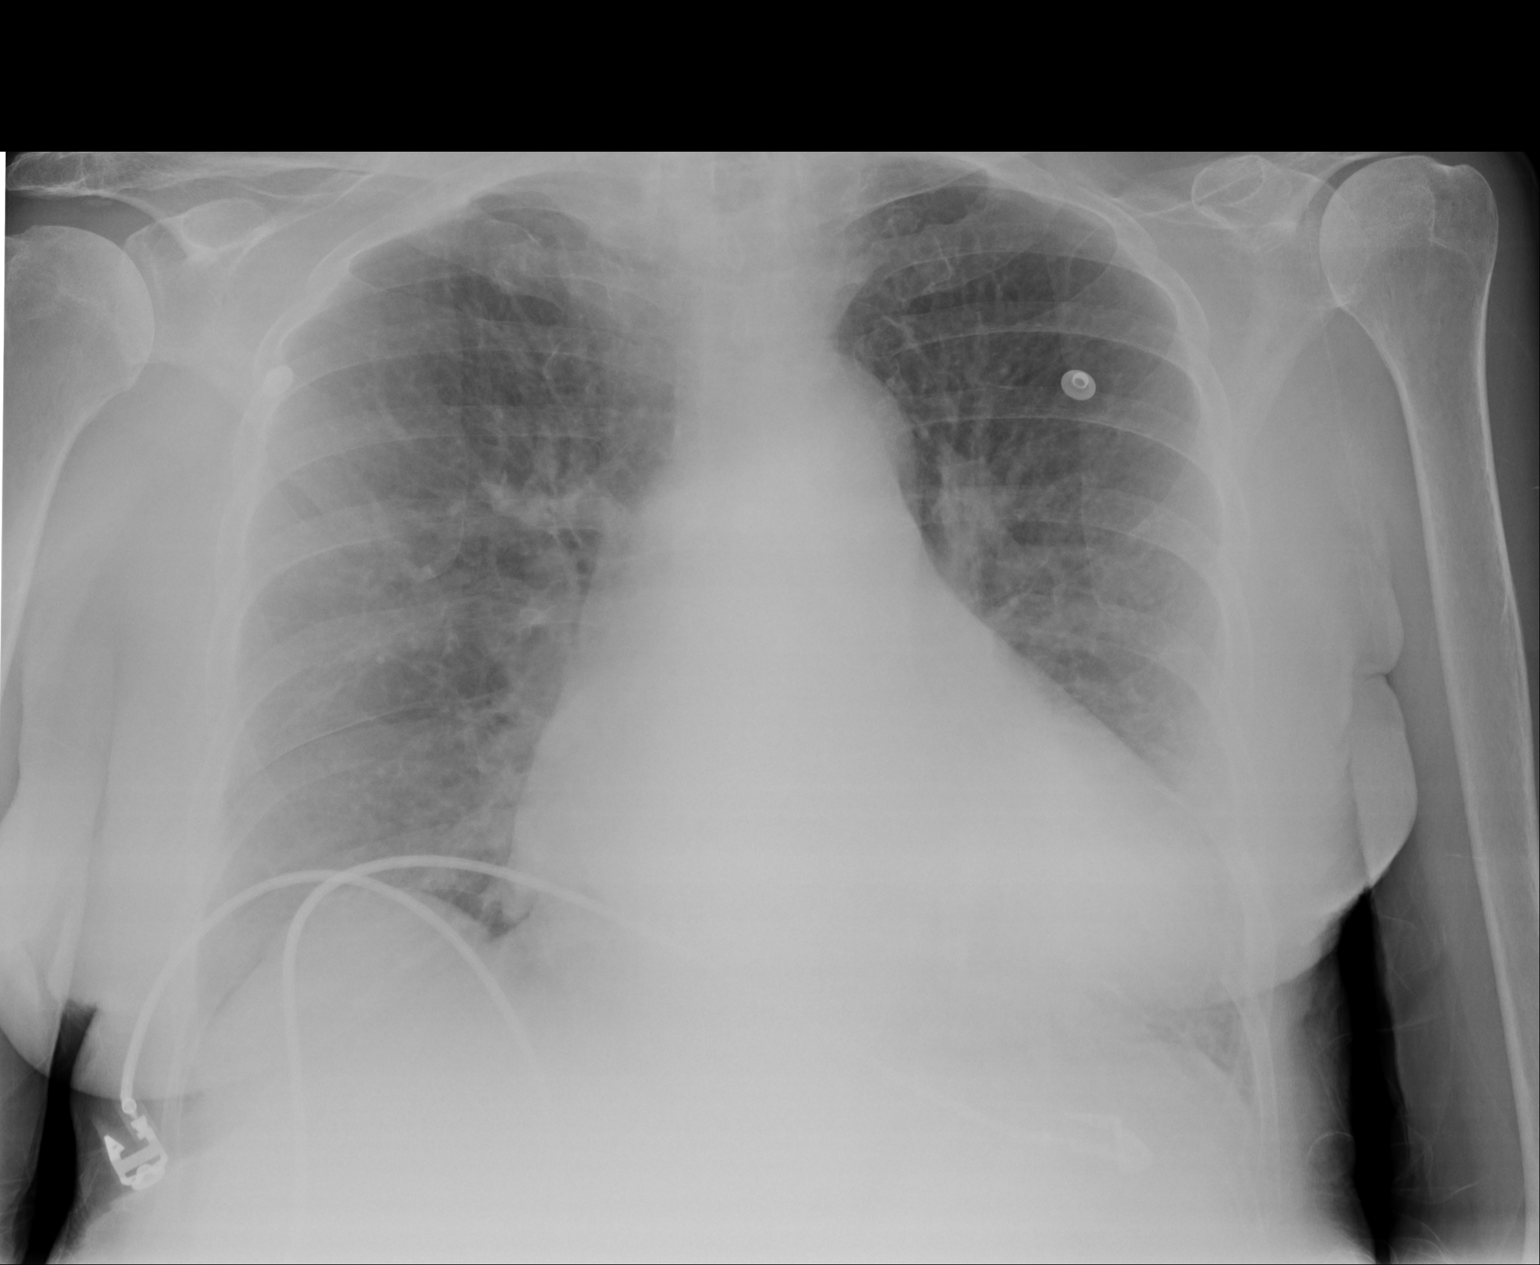

[2 of 2 positions shown; findings below may reference images not displayed]

PROCEDURE:     DXR - DXR CHEST 1 VIEWAP OR PA  - February 19, 2013  [DATE]

RESULT:     Comparison is made to the study of 08/31/2012. The cardiac
silhouette is enlarged. There is diffuse interstitial edema. The lung bases
are poorly penetrated. The appearance is grossly stable compare to the
previous study.
IMPRESSION: 1. Cardiomegaly with mild diffuse interstitial edema concerning for
congestive heart failure. Correlate clinically.

[REDACTED]

## 2014-10-20 IMAGING — CT CT HEAD WITHOUT CONTRAST
1 of 2 series · 16 of 30 positions shown, 20 images · non-contrast
Comparison: none

REASON FOR EXAM: weakness
COMMENTS:

[Series 2: soft tissue · axial · 0.41mm/px · z∈[+496,+631]mm · 16 of 31 slices shown, 20 images]
[im 2/31  brain]
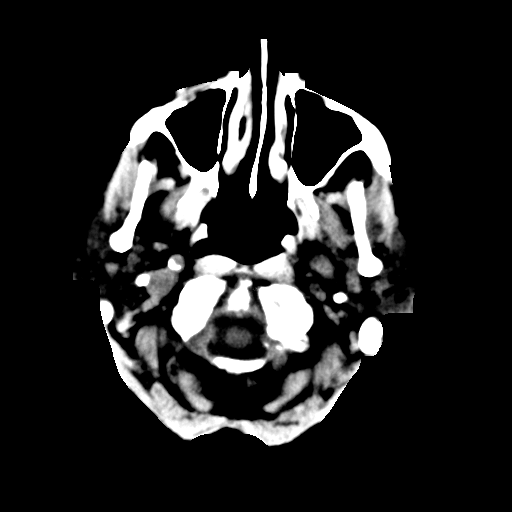
[im 2/31  bone]
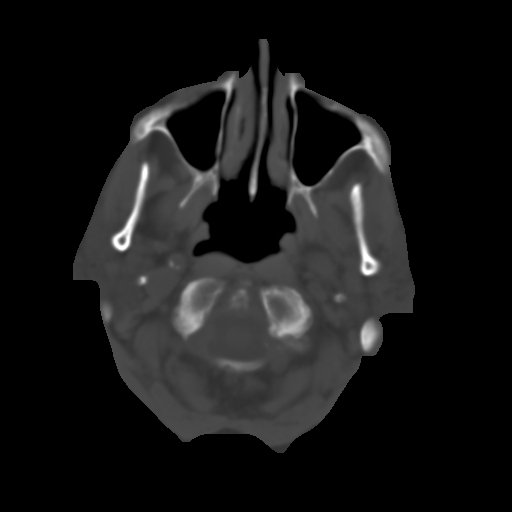
[im 4/31  brain]
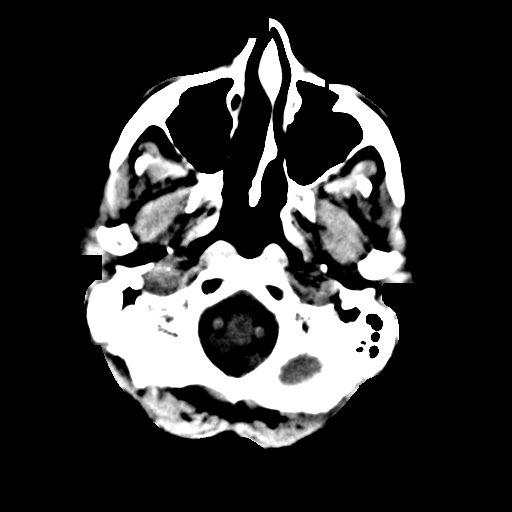
[im 5/31  brain]
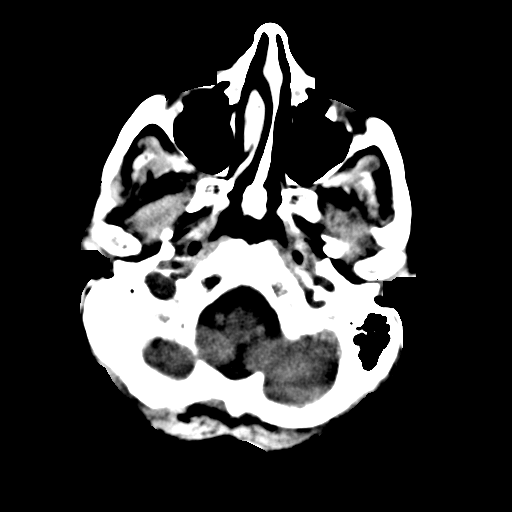
[im 7/31  brain]
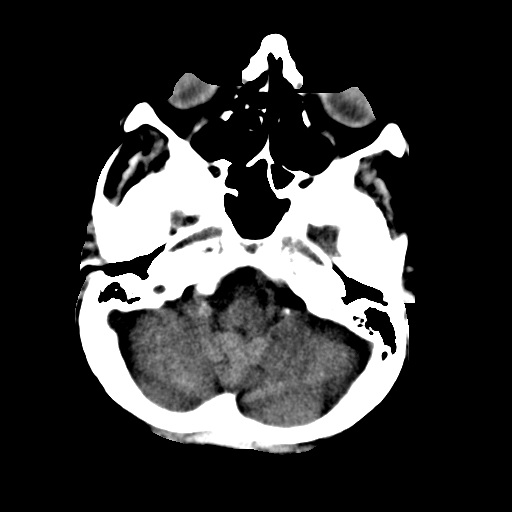
[im 10/31  brain]
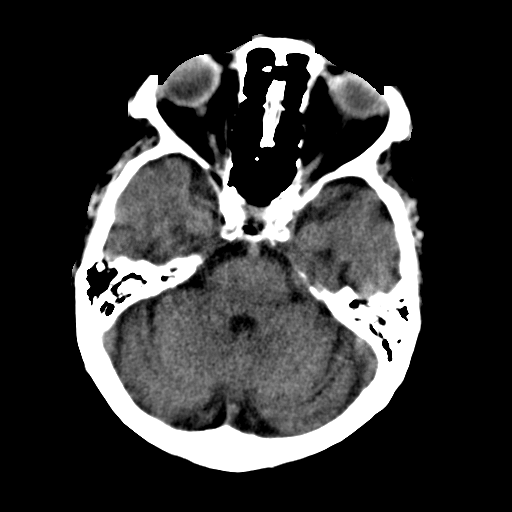
[im 10/31  bone]
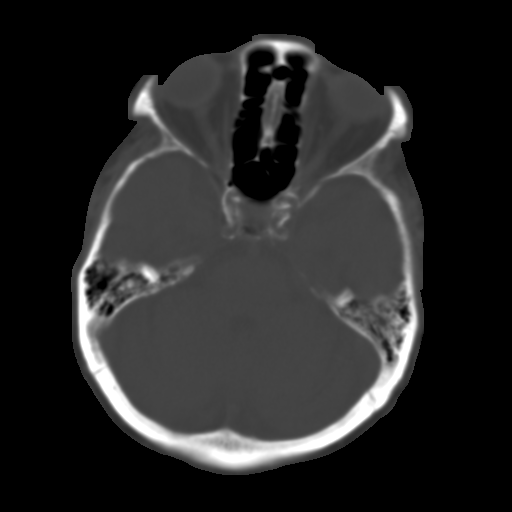
[im 12/31  brain]
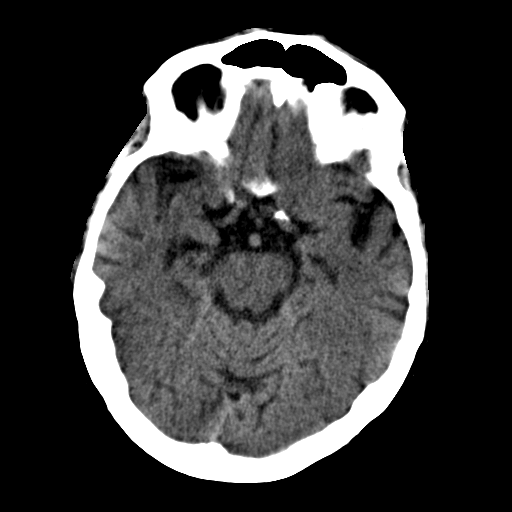
[im 13/31  brain]
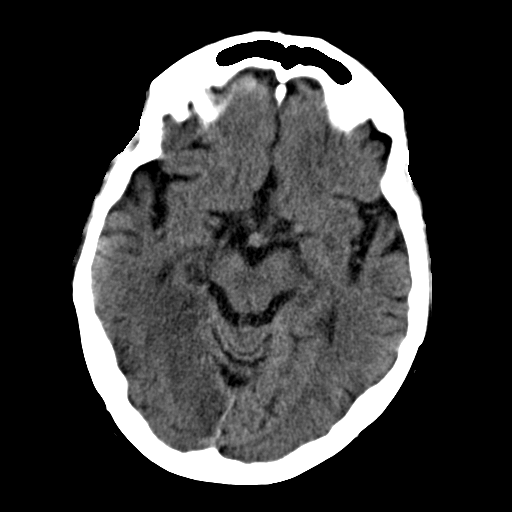
[im 15/31  brain]
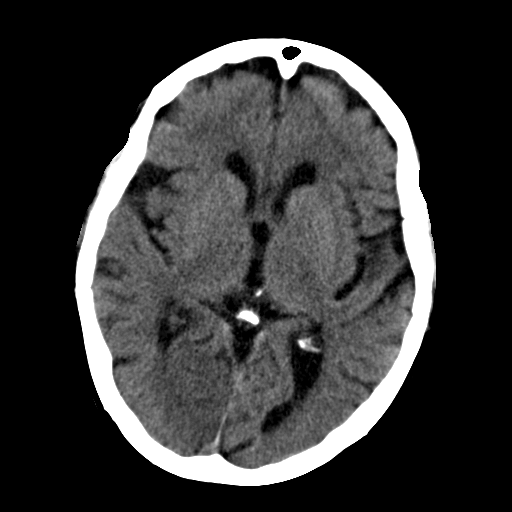
[im 16/31  brain]
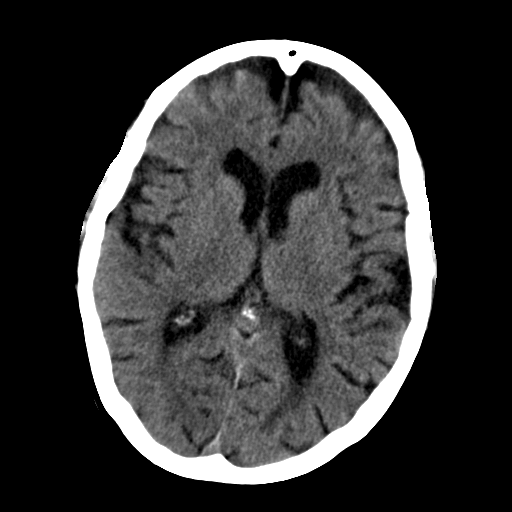
[im 16/31  bone]
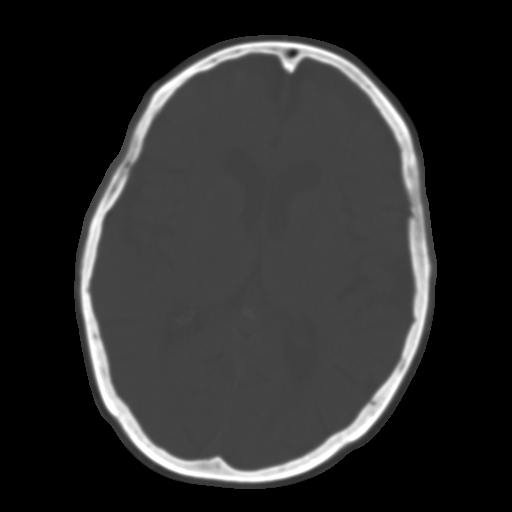
[im 18/31  brain]
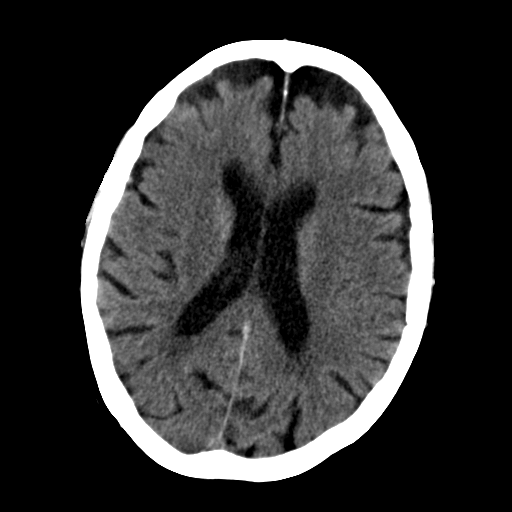
[im 19/31  brain]
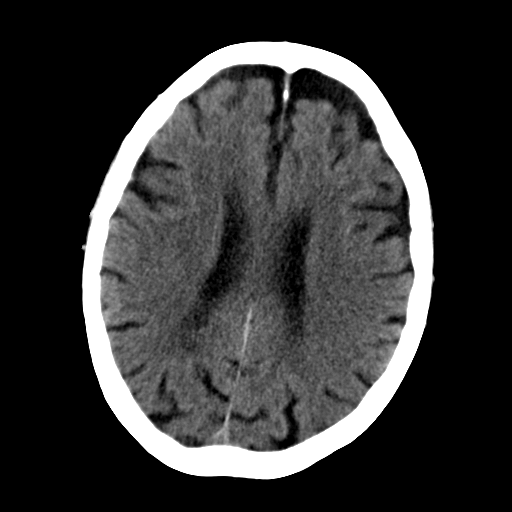
[im 21/31  brain]
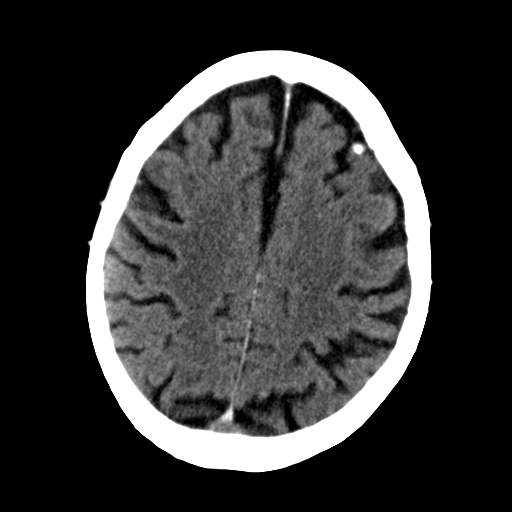
[im 24/31  brain]
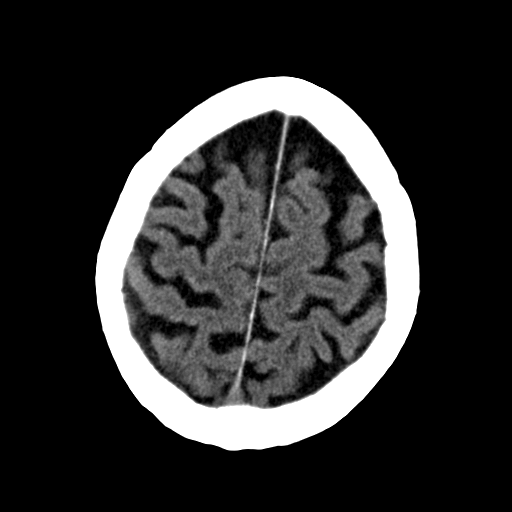
[im 24/31  bone]
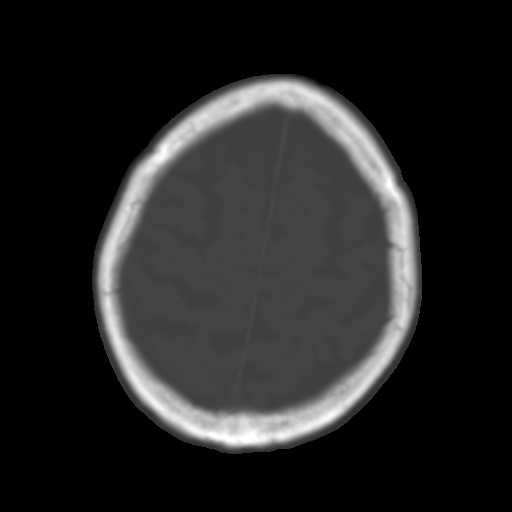
[im 26/31  brain]
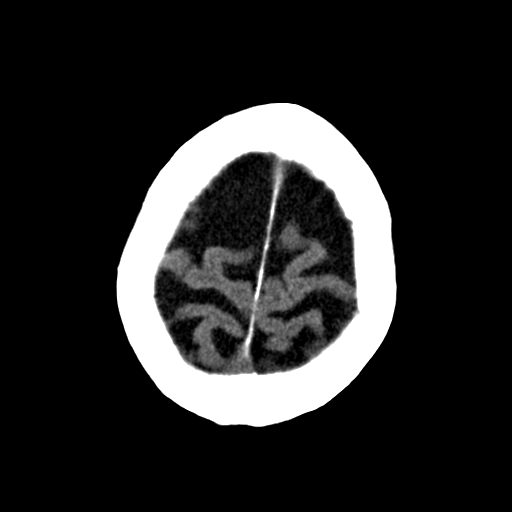
[im 27/31  brain]
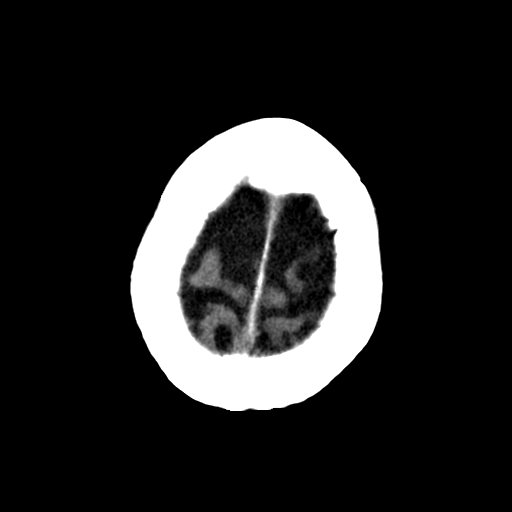
[im 29/31  brain]
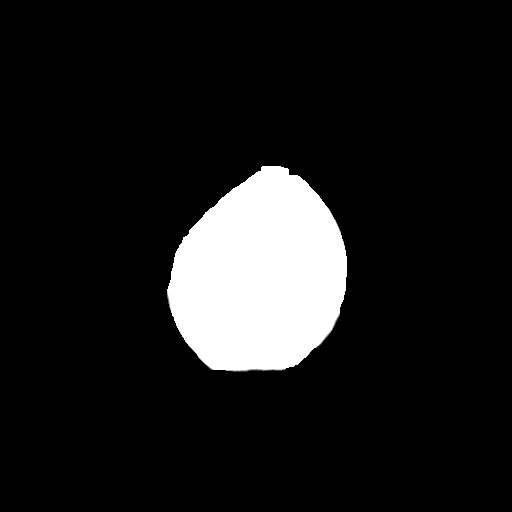

[16 of 30 positions shown; findings below may reference images not displayed]

PROCEDURE:     CT  - CT HEAD WITHOUT CONTRAST  - February 24, 2013 [DATE]

RESULT:     History: Weakness.

Comparison Study: No prior.

Fines: Standard nonenhanced CT obtained. Lucency is noted in the right
occipital lobe consistent with an infarction. This may be subacute. No
hemorrhage. No mass effect. No hydrocephalus. No acute bony abnormality.
IMPRESSION: Right occipital large area of infarction.

## 2015-05-12 ENCOUNTER — Emergency Department
Admission: EM | Admit: 2015-05-12 | Discharge: 2015-05-13 | Disposition: A | Discharge status: Home / Self Care | Payer: Medicare Other | Attending: Emergency Medicine | Admitting: Emergency Medicine

## 2015-05-12 ENCOUNTER — Emergency Department: Payer: Medicare Other

## 2015-05-12 ENCOUNTER — Encounter: Payer: Self-pay | Admitting: Emergency Medicine

## 2015-05-12 DIAGNOSIS — Z87891 Personal history of nicotine dependence: Secondary | ICD-10-CM | POA: Diagnosis not present

## 2015-05-12 DIAGNOSIS — R079 Chest pain, unspecified: Secondary | ICD-10-CM | POA: Diagnosis not present

## 2015-05-12 DIAGNOSIS — I2510 Atherosclerotic heart disease of native coronary artery without angina pectoris: Secondary | ICD-10-CM | POA: Insufficient documentation

## 2015-05-12 DIAGNOSIS — R011 Cardiac murmur, unspecified: Secondary | ICD-10-CM | POA: Diagnosis not present

## 2015-05-12 DIAGNOSIS — R0789 Other chest pain: Secondary | ICD-10-CM

## 2015-05-12 HISTORY — DX: Unspecified atrial fibrillation: I48.91

## 2015-05-12 LAB — CBC
HEMATOCRIT: 40.8 % (ref 35.0–47.0)
HEMOGLOBIN: 13.8 g/dL (ref 12.0–16.0)
MCH: 31.2 pg (ref 26.0–34.0)
MCHC: 33.7 g/dL (ref 32.0–36.0)
MCV: 92.6 fL (ref 80.0–100.0)
Platelets: 160 10*3/uL (ref 150–440)
RBC: 4.4 MIL/uL (ref 3.80–5.20)
RDW: 14.1 % (ref 11.5–14.5)
WBC: 5.8 10*3/uL (ref 3.6–11.0)

## 2015-05-12 LAB — BASIC METABOLIC PANEL
ANION GAP: 9 (ref 5–15)
BUN: 12 mg/dL (ref 6–20)
CHLORIDE: 102 mmol/L (ref 101–111)
CO2: 25 mmol/L (ref 22–32)
Calcium: 9.3 mg/dL (ref 8.9–10.3)
Creatinine, Ser: 0.8 mg/dL (ref 0.44–1.00)
GFR calc non Af Amer: >60 mL/min (ref >60)
Glucose, Bld: 112 mg/dL — ABNORMAL HIGH (ref 65–99)
Potassium: 3.5 mmol/L (ref 3.5–5.1)
SODIUM: 136 mmol/L (ref 135–145)

## 2015-05-12 LAB — TROPONIN I: Troponin I: 0.03 ng/mL (ref <0.031)

## 2015-05-12 NOTE — ED Provider Notes (Signed)
[facility] Emergency Department Provider Note   ____________________________________________  Time seen: 2130  I have reviewed the triage vital signs and the nursing notes.   HISTORY  Chief Complaint Chest Pain   History limited by: Not Limited   HPI Samantha Kerr is a 81 y.o. female with history of coronary artery disease, atrial fibrillation, unstable angina who presents to the emergency department today because of chest pressure. She states it started about 4 hours ago. It started suddenly. She states it is located in the central chest. It does not radiate. It is not associated with any shortness of breath or diaphoresis. At the time of my examination the patient states that it got better. She denies that it is any pain. She has had similar episodes of pain in the past and has had hospitalizations for this. Saw cardiologist in the past week, no recent cardiac concerns.    Past Medical History  Diagnosis Date  . Atrial fibrillation ([place])     There are no active problems to display for this patient.   History reviewed. No pertinent past surgical history.  No current outpatient prescriptions on file.  Allergies Sulfa antibiotics  History reviewed. No pertinent family history.  Social History Social History  Substance Use Topics  . Smoking status: Former Research scientist (life sciences)  . Smokeless tobacco: None  . Alcohol Use: No    Review of Systems  Constitutional: Negative for fever. Cardiovascular: Negative for chest pain. Positive for chest heaviness.  Respiratory: Negative for shortness of breath. Gastrointestinal: Negative for abdominal pain, vomiting and diarrhea. Neurological: Negative for headaches, focal weakness or numbness.  10-point ROS otherwise negative.  ____________________________________________   PHYSICAL EXAM:  VITAL SIGNS: ED Triage Vitals  Enc Vitals Group     BP 05/12/15 2030 174/73 mmHg     Pulse Rate 05/12/15 2030 37     Resp  05/12/15 2030 16     Temp 05/12/15 2032 98.5 F (36.9 C)     Temp Source 05/12/15 2032 Oral     SpO2 05/12/15 2030 95 %     Weight 05/12/15 2032 157 lb (71.215 kg)     Height 05/12/15 2032 5\' 9"  (1.753 m)     Head Cir --      Peak Flow --      Pain Score 05/12/15 2029 2   Constitutional: Alert and oriented. Well appearing and in no distress. Eyes: Conjunctivae are normal. PERRL. Normal extraocular movements. ENT   Head: Normocephalic and atraumatic.   Nose: No congestion/rhinnorhea.   Mouth/Throat: Mucous membranes are moist.   Neck: No stridor. Hematological/Lymphatic/Immunilogical: No cervical lymphadenopathy. Cardiovascular: Normal rate, regular rhythm.  Systolic grade II/VI murmur.  Respiratory: Normal respiratory effort without tachypnea nor retractions. Breath sounds are clear and equal bilaterally. No wheezes/rales/rhonchi. Gastrointestinal: Soft and nontender. No distention.  Genitourinary: Deferred Musculoskeletal: Normal range of motion in all extremities. No joint effusions.  No lower extremity tenderness nor edema. Neurologic:  Normal speech and language. No gross focal neurologic deficits are appreciated.  Skin:  Skin is warm, dry and intact. No rash noted. Psychiatric: Mood and affect are normal. Speech and behavior are normal. Patient exhibits appropriate insight and judgment.  ____________________________________________    LABS (pertinent positives/negatives)  Labs Reviewed  BASIC METABOLIC PANEL - Abnormal; Notable for the following:    Glucose, Bld 112 (*)    All other components within normal limits  CBC  TROPONIN I     ____________________________________________   EKG  I, Carter Kitten  Archie Balboa, attending physician, personally viewed and interpreted this EKG  EKG Time: 2031 Rate: 89 Rhythm: atrial fibrillation Axis: left axis deviation Intervals: qtc 509 QRS: incomplete RBBB, LAFB ST changes: no st elevation Impression: abnormal  ekg ____________________________________________    RADIOLOGY  CXR  IMPRESSION: No acute abnormality. Stable cardiomegaly, pulmonary vascular congestion and minimal chronic interstitial lung disease.  ____________________________________________   PROCEDURES  Procedure(s) performed: None  Critical Care performed: No  ____________________________________________   INITIAL IMPRESSION / ASSESSMENT AND PLAN / ED COURSE  Pertinent labs & imaging results that were available during my care of the patient were reviewed by me and considered in my medical decision making (see chart for details).  Patient presented to the emergency department today because of an episode of chest heaviness. Patient does have history of coronary artery disease and a stent in the past. She has also had the same symptoms more recently with negative workups. The initial troponin was negative. I had a discussion with the patient and family about the plan. I did offer admission. However patient expressed desire to go home. Patient was discomfort free. I discussed and we agreed to get a second troponin. At this point I think this is reasonable plan given the patient is pain-free and has had similar episodes of chest heaviness with negative cardiac workup. Patient does have an established cardiologist who she can follow up with. Will prepare paperwork in the event that the second troponin is negative.  ____________________________________________   FINAL CLINICAL IMPRESSION(S) / ED DIAGNOSES  Final diagnoses:  Chest pressure     Nance Pear, MD 05/13/15 [phone]

## 2015-05-12 NOTE — ED Notes (Signed)
Pt assisted to bathroom by 2 RN for continent void

## 2015-05-12 NOTE — ED Notes (Signed)
Pt presents to the ER with chest pressure that is located on the left side and started around 1800. Pt states " it is like 2 or 3 lbs"; more so heaviness. Pt is alert and oriented

## 2015-05-12 NOTE — Discharge Instructions (Signed)
Please seek medical attention for any high fevers, chest pain, shortness of breath, change in behavior, persistent vomiting, bloody stool or any other new or concerning symptoms. ° ° °Nonspecific Chest Pain  °Chest pain can be caused by many different conditions. There is always a chance that your pain could be related to something serious, such as a heart attack or a blood clot in your lungs. Chest pain can also be caused by conditions that are not life-threatening. If you have chest pain, it is very important to follow up with your health care provider. °CAUSES  °Chest pain can be caused by: °· Heartburn. °· Pneumonia or bronchitis. °· Anxiety or stress. °· Inflammation around your heart (pericarditis) or lung (pleuritis or pleurisy). °· A blood clot in your lung. °· A collapsed lung (pneumothorax). It can develop suddenly on its own (spontaneous pneumothorax) or from trauma to the chest. °· Shingles infection (varicella-zoster virus). °· Heart attack. °· Damage to the bones, muscles, and cartilage that make up your chest wall. This can include: °¨ Bruised bones due to injury. °¨ Strained muscles or cartilage due to frequent or repeated coughing or overwork. °¨ Fracture to one or more ribs. °¨ Sore cartilage due to inflammation (costochondritis). °RISK FACTORS  °Risk factors for chest pain may include: °· Activities that increase your risk for trauma or injury to your chest. °· Respiratory infections or conditions that cause frequent coughing. °· Medical conditions or overeating that can cause heartburn. °· Heart disease or family history of heart disease. °· Conditions or health behaviors that increase your risk of developing a blood clot. °· Having had chicken pox (varicella zoster). °SIGNS AND SYMPTOMS °Chest pain can feel like: °· Burning or tingling on the surface of your chest or deep in your chest. °· Crushing, pressure, aching, or squeezing pain. °· Dull or sharp pain that is worse when you move, cough, or  take a deep breath. °· Pain that is also felt in your back, neck, shoulder, or arm, or pain that spreads to any of these areas. °Your chest pain may come and go, or it may stay constant. °DIAGNOSIS °Lab tests or other studies may be needed to find the cause of your pain. Your health care provider may have you take a test called an ambulatory ECG (electrocardiogram). An ECG records your heartbeat patterns at the time the test is performed. You may also have other tests, such as: °· Transthoracic echocardiogram (TTE). During echocardiography, sound waves are used to create a picture of all of the heart structures and to look at how blood flows through your heart. °· Transesophageal echocardiogram (TEE). This is a more advanced imaging test that obtains images from inside your body. It allows your health care provider to see your heart in finer detail. °· Cardiac monitoring. This allows your health care provider to monitor your heart rate and rhythm in real time. °· Holter monitor. This is a portable device that records your heartbeat and can help to diagnose abnormal heartbeats. It allows your health care provider to track your heart activity for several days, if needed. °· Stress tests. These can be done through exercise or by taking medicine that makes your heart beat more quickly. °· Blood tests. °· Imaging tests. °TREATMENT  °Your treatment depends on what is causing your chest pain. Treatment may include: °· Medicines. These may include: °¨ Acid blockers for heartburn. °¨ Anti-inflammatory medicine. °¨ Pain medicine for inflammatory conditions. °¨ Antibiotic medicine, if an infection is present. °¨ Medicines   to dissolve blood clots. °¨ Medicines to treat coronary artery disease. °· Supportive care for conditions that do not require medicines. This may include: °¨ Resting. °¨ Applying heat or cold packs to injured areas. °¨ Limiting activities until pain decreases. °HOME CARE INSTRUCTIONS °· If you were prescribed  an antibiotic medicine, finish it all even if you start to feel better. °· Avoid any activities that bring on chest pain. °· Do not use any tobacco products, including cigarettes, chewing tobacco, or electronic cigarettes. If you need help quitting, ask your health care provider. °· Do not drink alcohol. °· Take medicines only as directed by your health care provider. °· Keep all follow-up visits as directed by your health care provider. This is important. This includes any further testing if your chest pain does not go away. °· If heartburn is the cause for your chest pain, you may be told to keep your head raised (elevated) while sleeping. This reduces the chance that acid will go from your stomach into your esophagus. °· Make lifestyle changes as directed by your health care provider. These may include: °¨ Getting regular exercise. Ask your health care provider to suggest some activities that are safe for you. °¨ Eating a heart-healthy diet. A registered dietitian can help you to learn healthy eating options. °¨ Maintaining a healthy weight. °¨ Managing diabetes, if necessary. °¨ Reducing stress. °SEEK MEDICAL CARE IF: °· Your chest pain does not go away after treatment. °· You have a rash with blisters on your chest. °· You have a fever. °SEEK IMMEDIATE MEDICAL CARE IF:  °· Your chest pain is worse. °· You have an increasing cough, or you cough up blood. °· You have severe abdominal pain. °· You have severe weakness. °· You faint. °· You have chills. °· You have sudden, unexplained chest discomfort. °· You have sudden, unexplained discomfort in your arms, back, neck, or jaw. °· You have shortness of breath at any time. °· You suddenly start to sweat, or your skin gets clammy. °· You feel nauseous or you vomit. °· You suddenly feel light-headed or dizzy. °· Your heart begins to beat quickly, or it feels like it is skipping beats. °These symptoms may represent a serious problem that is an emergency. Do not wait to  see if the symptoms will go away. Get medical help right away. Call your local emergency services (911 in the [place] Do not drive yourself to the hospital. °  °This information is not intended to replace advice given to you by your health care provider. Make sure you discuss any questions you have with your health care provider. °  °Document Released: 02/07/2005 Document Revised: 05/21/2014 Document Reviewed: 12/04/2013 °Elsevier Interactive Patient Education ©2016 [employer] ° °

## 2015-05-13 LAB — TROPONIN I: Troponin I: 0.03 ng/mL (ref <0.031)

## 2015-05-13 NOTE — ED Notes (Signed)
AAOx3.  Skin warm and dry.  No SOB/ DOE.  NAD 

## 2016-01-18 ENCOUNTER — Observation Stay
Admission: EM | Admit: 2016-01-18 | Discharge: 2016-01-19 | Disposition: A | Discharge status: Hospice | Payer: Medicare Other | Attending: Internal Medicine | Admitting: Internal Medicine

## 2016-01-18 ENCOUNTER — Emergency Department: Payer: Medicare Other

## 2016-01-18 DIAGNOSIS — K219 Gastro-esophageal reflux disease without esophagitis: Secondary | ICD-10-CM | POA: Insufficient documentation

## 2016-01-18 DIAGNOSIS — I10 Essential (primary) hypertension: Secondary | ICD-10-CM | POA: Diagnosis present

## 2016-01-18 DIAGNOSIS — E782 Mixed hyperlipidemia: Secondary | ICD-10-CM | POA: Diagnosis not present

## 2016-01-18 DIAGNOSIS — R0789 Other chest pain: Principal | ICD-10-CM | POA: Insufficient documentation

## 2016-01-18 DIAGNOSIS — Z885 Allergy status to narcotic agent status: Secondary | ICD-10-CM | POA: Insufficient documentation

## 2016-01-18 DIAGNOSIS — I2510 Atherosclerotic heart disease of native coronary artery without angina pectoris: Secondary | ICD-10-CM | POA: Diagnosis not present

## 2016-01-18 DIAGNOSIS — Z823 Family history of stroke: Secondary | ICD-10-CM | POA: Diagnosis not present

## 2016-01-18 DIAGNOSIS — Z79899 Other long term (current) drug therapy: Secondary | ICD-10-CM | POA: Insufficient documentation

## 2016-01-18 DIAGNOSIS — Z7982 Long term (current) use of aspirin: Secondary | ICD-10-CM | POA: Insufficient documentation

## 2016-01-18 DIAGNOSIS — Z8249 Family history of ischemic heart disease and other diseases of the circulatory system: Secondary | ICD-10-CM | POA: Insufficient documentation

## 2016-01-18 DIAGNOSIS — Z803 Family history of malignant neoplasm of breast: Secondary | ICD-10-CM | POA: Diagnosis not present

## 2016-01-18 DIAGNOSIS — I4891 Unspecified atrial fibrillation: Secondary | ICD-10-CM | POA: Diagnosis present

## 2016-01-18 DIAGNOSIS — Z882 Allergy status to sulfonamides status: Secondary | ICD-10-CM | POA: Diagnosis not present

## 2016-01-18 DIAGNOSIS — I110 Hypertensive heart disease with heart failure: Secondary | ICD-10-CM | POA: Diagnosis not present

## 2016-01-18 DIAGNOSIS — Z87891 Personal history of nicotine dependence: Secondary | ICD-10-CM | POA: Insufficient documentation

## 2016-01-18 DIAGNOSIS — I350 Nonrheumatic aortic (valve) stenosis: Secondary | ICD-10-CM | POA: Diagnosis not present

## 2016-01-18 DIAGNOSIS — I5033 Acute on chronic diastolic (congestive) heart failure: Secondary | ICD-10-CM | POA: Diagnosis not present

## 2016-01-18 DIAGNOSIS — I5032 Chronic diastolic (congestive) heart failure: Secondary | ICD-10-CM | POA: Diagnosis present

## 2016-01-18 DIAGNOSIS — R778 Other specified abnormalities of plasma proteins: Secondary | ICD-10-CM

## 2016-01-18 DIAGNOSIS — R079 Chest pain, unspecified: Secondary | ICD-10-CM | POA: Diagnosis present

## 2016-01-18 DIAGNOSIS — Z9889 Other specified postprocedural states: Secondary | ICD-10-CM | POA: Insufficient documentation

## 2016-01-18 DIAGNOSIS — Z7951 Long term (current) use of inhaled steroids: Secondary | ICD-10-CM | POA: Diagnosis not present

## 2016-01-18 DIAGNOSIS — I482 Chronic atrial fibrillation: Secondary | ICD-10-CM | POA: Insufficient documentation

## 2016-01-18 DIAGNOSIS — Z955 Presence of coronary angioplasty implant and graft: Secondary | ICD-10-CM | POA: Diagnosis not present

## 2016-01-18 DIAGNOSIS — Z515 Encounter for palliative care: Secondary | ICD-10-CM | POA: Diagnosis not present

## 2016-01-18 DIAGNOSIS — R7989 Other specified abnormal findings of blood chemistry: Secondary | ICD-10-CM

## 2016-01-18 DIAGNOSIS — Z90710 Acquired absence of both cervix and uterus: Secondary | ICD-10-CM | POA: Insufficient documentation

## 2016-01-18 DIAGNOSIS — Z66 Do not resuscitate: Secondary | ICD-10-CM | POA: Insufficient documentation

## 2016-01-18 DIAGNOSIS — R002 Palpitations: Secondary | ICD-10-CM

## 2016-01-18 HISTORY — DX: Gastro-esophageal reflux disease without esophagitis: K21.9

## 2016-01-18 HISTORY — DX: Nonrheumatic aortic (valve) stenosis: I35.0

## 2016-01-18 HISTORY — DX: Pure hypercholesterolemia, unspecified: E78.00

## 2016-01-18 HISTORY — DX: Essential (primary) hypertension: I10

## 2016-01-18 HISTORY — DX: Unspecified atrial fibrillation: I48.91

## 2016-01-18 HISTORY — DX: Malignant (primary) neoplasm, unspecified: C80.1

## 2016-01-18 HISTORY — DX: Heart failure, unspecified: I50.9

## 2016-01-18 LAB — COMPREHENSIVE METABOLIC PANEL
ALBUMIN: 4 g/dL (ref 3.5–5.0)
ALT: 16 U/L (ref 14–54)
ANION GAP: 5 (ref 5–15)
AST: 23 U/L (ref 15–41)
Alkaline Phosphatase: 72 U/L (ref 38–126)
BUN: 12 mg/dL (ref 6–20)
CO2: 28 mmol/L (ref 22–32)
Calcium: 9 mg/dL (ref 8.9–10.3)
Chloride: 101 mmol/L (ref 101–111)
Creatinine, Ser: 0.83 mg/dL (ref 0.44–1.00)
GFR calc Af Amer: >60 mL/min (ref >60)
GFR calc non Af Amer: 59 mL/min — ABNORMAL LOW (ref >60)
GLUCOSE: 118 mg/dL — AB (ref 65–99)
POTASSIUM: 3.5 mmol/L (ref 3.5–5.1)
SODIUM: 134 mmol/L — AB (ref 135–145)
Total Bilirubin: 0.8 mg/dL (ref 0.3–1.2)
Total Protein: 7.1 g/dL (ref 6.5–8.1)

## 2016-01-18 LAB — CBC WITH DIFFERENTIAL/PLATELET
BASOS ABS: 0.1 10*3/uL (ref 0–0.1)
BASOS PCT: 1 %
EOS ABS: 0.1 10*3/uL (ref 0–0.7)
Eosinophils Relative: 2 %
HCT: 37.7 % (ref 35.0–47.0)
HEMOGLOBIN: 13 g/dL (ref 12.0–16.0)
Lymphocytes Relative: 10 %
Lymphs Abs: 0.6 10*3/uL — ABNORMAL LOW (ref 1.0–3.6)
MCH: 31.4 pg (ref 26.0–34.0)
MCHC: 34.5 g/dL (ref 32.0–36.0)
MCV: 91.1 fL (ref 80.0–100.0)
Monocytes Absolute: 0.5 10*3/uL (ref 0.2–0.9)
Monocytes Relative: 9 %
NEUTROS PCT: 78 %
Neutro Abs: 4.4 10*3/uL (ref 1.4–6.5)
Platelets: 137 10*3/uL — ABNORMAL LOW (ref 150–440)
RBC: 4.14 MIL/uL (ref 3.80–5.20)
RDW: 14.7 % — AB (ref 11.5–14.5)
WBC: 5.7 10*3/uL (ref 3.6–11.0)

## 2016-01-18 LAB — TROPONIN I: Troponin I: 0.04 ng/mL (ref <0.03)

## 2016-01-18 LAB — MAGNESIUM: Magnesium: 1.9 mg/dL (ref 1.7–2.4)

## 2016-01-18 MED ORDER — METOPROLOL TARTRATE 5 MG/5ML IV SOLN
2.5000 mg | Freq: Once | INTRAVENOUS | Status: AC
  Administered 2016-01-18: 2.5 mg via INTRAVENOUS
  Filled 2016-01-18: qty 5

## 2016-01-18 NOTE — ED Provider Notes (Signed)
[facility] Emergency Department Provider Note    First MD Initiated Contact with Patient 01/18/16 2034     (approximate)  I have reviewed the triage vital signs and the nursing notes.   HISTORY  Chief Complaint Chest Pain    HPI Samantha Kerr is a 82 y.o. female with history of A. fib as well as aortic stenosis not on any anticoagulation due to severe GI bleeds when anticoagulated presents with brief episode of palpitations and chest pain that occurred today around 6 PM. She also status post CAD with one stent placed. On arrival to the ER she denies any shortness of breath or chest pain. Does state that she still feels palpitations. Denies any significant weight gain or worsening orthopnea. Denies any productive cough. No recent fevers. No nausea or vomiting.   Past Medical History:  Diagnosis Date  . A-fib ([place])   . Aortic stenosis   . Atrial fibrillation ([place])   . Cancer ([place])   . CHF (congestive heart failure) ([place])   . High cholesterol   . Hypertension     There are no active problems to display for this patient.   Past Surgical History:  Procedure Laterality Date  . ABDOMINAL HYSTERECTOMY    . BREAST SURGERY    . CARDIAC CATHETERIZATION      Prior to Admission medications   Medication Sig Start Date End Date Taking? Authorizing Provider  aspirin EC 81 MG tablet Take 81 mg by mouth daily.   Yes Historical Provider, MD  atorvastatin (LIPITOR) 40 MG tablet Take 40 mg by mouth every evening.   Yes Historical Provider, MD  bimatoprost (LUMIGAN) 0.03 % ophthalmic solution Place 1 drop into both eyes at bedtime.   Yes Historical Provider, MD  calcium carbonate (CALCIUM 600) 1500 (600 Ca) MG TABS tablet Take 600 mg of elemental calcium by mouth daily with breakfast.   Yes Historical Provider, MD  CARTIA XT 120 MG 24 hr capsule Take 120 mg by mouth daily.   Yes Historical Provider, MD  docusate sodium (COLACE) 100 MG capsule Take 100 mg by mouth 3  (three) times daily.   Yes Historical Provider, MD  dorzolamide (TRUSOPT) 2 % ophthalmic solution Place 1 drop into both eyes 3 (three) times daily.   Yes Historical Provider, MD  Ferrous Sulfate (IRON) 325 (65 Fe) MG TABS Take 1 tablet by mouth daily.   Yes Historical Provider, MD  fluticasone (FLONASE) 50 MCG/ACT nasal spray Place 1 spray into both nostrils daily.   Yes Historical Provider, MD  furosemide (LASIX) 20 MG tablet Take 20 mg by mouth daily.   Yes Historical Provider, MD  omeprazole (PRILOSEC) 20 MG capsule Take 20 mg by mouth daily.   Yes Historical Provider, MD    Allergies Codeine and Sulfa antibiotics  Family History  Problem Relation Age of Onset  . Heart disease Mother   . Stroke Mother   . Breast cancer Sister     Social History Social History  Substance Use Topics  . Smoking status: Former Research scientist (life sciences)  . Smokeless tobacco: Never Used  . Alcohol use No    Review of Systems Patient denies headaches, rhinorrhea, blurry vision, numbness, shortness of breath, chest pain, edema, cough, abdominal pain, nausea, vomiting, diarrhea, dysuria, fevers, rashes or hallucinations unless otherwise stated above in HPI. ____________________________________________   PHYSICAL EXAM:  VITAL SIGNS: Vitals:   01/18/16 2230 01/18/16 2330  BP: (!) 145/80 (!) 155/71  Pulse: 69 62  Resp:  17 20  Temp:      Constitutional: Alert and oriented. Elderly frail in no acute distress Eyes: Conjunctivae are normal. PERRL. EOMI. Head: Atraumatic. Nose: No congestion/rhinnorhea. Mouth/Throat: Mucous membranes are moist.  Oropharynx non-erythematous. Neck: No stridor. Painless ROM. No cervical spine tenderness to palpation Hematological/Lymphatic/Immunilogical: No cervical lymphadenopathy. Cardiovascular: Normal rate, regular rhythm. Harsh holosystolic murmur heard best at the left upper sternal border.  Good peripheral circulation. Respiratory: Normal respiratory effort.  No retractions.  Lungs CTAB. Gastrointestinal: Soft and nontender. No distention. No abdominal bruits. No CVA tenderness. Genitourinary:  Musculoskeletal: No lower extremity tenderness nor edema.  No joint effusions. Neurologic:  Normal speech and language. No gross focal neurologic deficits are appreciated. No gait instability. Skin:  Skin is warm, dry and intact. No rash noted. Psychiatric: Mood and affect are normal. Speech and behavior are normal.  ____________________________________________   LABS (all labs ordered are listed, but only abnormal results are displayed)  Results for orders placed or performed during the hospital encounter of 01/18/16 (from the past 24 hour(s))  CBC with Differential/Platelet     Status: Abnormal   Collection Time: 01/18/16  8:46 PM  Result Value Ref Range   WBC 5.7 3.6 - 11.0 K/uL   RBC 4.14 3.80 - 5.20 MIL/uL   Hemoglobin 13.0 12.0 - 16.0 g/dL   HCT 37.7 35.0 - 47.0 %   MCV 91.1 80.0 - 100.0 fL   MCH 31.4 26.0 - 34.0 pg   MCHC 34.5 32.0 - 36.0 g/dL   RDW 14.7 (H) 11.5 - 14.5 %   Platelets 137 (L) 150 - 440 K/uL   Neutrophils Relative % 78 %   Neutro Abs 4.4 1.4 - 6.5 K/uL   Lymphocytes Relative 10 %   Lymphs Abs 0.6 (L) 1.0 - 3.6 K/uL   Monocytes Relative 9 %   Monocytes Absolute 0.5 0.2 - 0.9 K/uL   Eosinophils Relative 2 %   Eosinophils Absolute 0.1 0 - 0.7 K/uL   Basophils Relative 1 %   Basophils Absolute 0.1 0 - 0.1 K/uL  Comprehensive metabolic panel     Status: Abnormal   Collection Time: 01/18/16  8:46 PM  Result Value Ref Range   Sodium 134 (L) 135 - 145 mmol/L   Potassium 3.5 3.5 - 5.1 mmol/L   Chloride 101 101 - 111 mmol/L   CO2 28 22 - 32 mmol/L   Glucose, Bld 118 (H) 65 - 99 mg/dL   BUN 12 6 - 20 mg/dL   Creatinine, Ser 0.83 0.44 - 1.00 mg/dL   Calcium 9.0 8.9 - 10.3 mg/dL   Total Protein 7.1 6.5 - 8.1 g/dL   Albumin 4.0 3.5 - 5.0 g/dL   AST 23 15 - 41 U/L   ALT 16 14 - 54 U/L   Alkaline Phosphatase 72 38 - 126 U/L   Total  Bilirubin 0.8 0.3 - 1.2 mg/dL   GFR calc non Af Amer 59 (L) >60 mL/min   GFR calc Af Amer >60 >60 mL/min   Anion gap 5 5 - 15  Troponin I     Status: Abnormal   Collection Time: 01/18/16  8:46 PM  Result Value Ref Range   Troponin I 0.04 (HH) <0.03 ng/mL  Magnesium     Status: None   Collection Time: 01/18/16  8:46 PM  Result Value Ref Range   Magnesium 1.9 1.7 - 2.4 mg/dL   ____________________________________________  EKG My review and personal interpretation at Time: 20:36   Indication:  chest pain  Rate: 95  Rhythm: sinus with frequent PAcs Axis: left Other: poor r wave progression, no acute ST elevations or depressions ____________________________________________  RADIOLOGY  CXR IMPRESSION: Mild central vascular congestion consistent with early CHF.  ____________________________________________   PROCEDURES  Procedure(s) performed: none    Critical Care performed: no ____________________________________________   INITIAL IMPRESSION / ASSESSMENT AND PLAN / ED COURSE  Pertinent labs & imaging results that were available during my care of the patient were reviewed by me and considered in my medical decision making (see chart for details).  DDX: ACS, pericarditis, esophagitis, boerhaaves, pe, dissection, pna, bronchitis, costochondritis   ADYA EFFLER is a 82 y.o. who presents to the ED with reported chest pain, palpitations weakness and shortness of breath. She arrives afebrile and hemodynamic stable. EKG does show frequent PACs and telemetry shows frequent PVCs. Patient with very chronically ill cardiac condition. Has been offered TAVR in the past but declined. States that she would not want any further interventions done on her heart. Just wants to feel better. Her EKG does not show any acute ischemia by troponin does show mild elevation to 0.04. Chest x-ray with mild pulmonary vascular congestion without any effusions. She does not have any acute hypoxia. Based on  her severe AS do not want to aggressively lower afterload due to concern for worsening of her condition. Her abdominal exam is soft and benign at this time. Based on her comorbidities and underlying cardiac disease do feel patient would benefit from telemetry monitoring, blood pressure control and further medical management.  Do not feel this represents any acute infectious process.  I spoke with the family as well as Dr. Jannifer Franklin regarding the patient's presentation and all are in agreement with plan for admission to hospital for further evaluation and management.  Have discussed with the patient and available family all diagnostics and treatments performed thus far and all questions were answered to the best of my ability. The patient demonstrates understanding and agreement with plan.   Clinical Course     ____________________________________________   FINAL CLINICAL IMPRESSION(S) / ED DIAGNOSES  Final diagnoses:  Chest pain, unspecified chest pain type  Palpitations  Elevated troponin      NEW MEDICATIONS STARTED DURING THIS VISIT:  New Prescriptions   No medications on file     Note:  This document was prepared using Dragon voice recognition software and may include unintentional dictation errors.    Merlyn Lot, MD 01/18/16 [phone]

## 2016-01-18 NOTE — ED Notes (Signed)
Pt ambulated to restroom with 1 assist (states she normally uses walker). Pt given milk and graham crackers.

## 2016-01-18 NOTE — ED Triage Notes (Signed)
Pt arrived via ems for c/o chest pain/pressure since 6pm today - denies nausea/vomiting - hx of afib and heart rate 70's-160's during transport - ems gave 324mg  of ASA - no NTG given - hx of MI x1 - lungs clear to all lobes - respirations even and unlabored

## 2016-01-18 NOTE — ED Notes (Addendum)
Pt arrived via ems for c/o chest pain/pressure since 6pm today - denies nausea/vomiting - hx of afib and heart rate 70's-160's during transport - ems gave 324mg  of ASA - no NTG given - hx of MI x1 - lungs clear to all lobes - respirations even and unlabored - pt denies any pain or pressure at this time - skin warm/dry

## 2016-01-19 ENCOUNTER — Observation Stay
Admit: 2016-01-19 | Discharge: 2016-01-19 | Disposition: A | Discharge status: Home / Self Care | Payer: Medicare Other | Attending: Internal Medicine | Admitting: Internal Medicine

## 2016-01-19 DIAGNOSIS — K219 Gastro-esophageal reflux disease without esophagitis: Secondary | ICD-10-CM | POA: Diagnosis present

## 2016-01-19 DIAGNOSIS — R0789 Other chest pain: Secondary | ICD-10-CM | POA: Diagnosis not present

## 2016-01-19 DIAGNOSIS — R079 Chest pain, unspecified: Secondary | ICD-10-CM | POA: Diagnosis present

## 2016-01-19 DIAGNOSIS — I5032 Chronic diastolic (congestive) heart failure: Secondary | ICD-10-CM | POA: Diagnosis present

## 2016-01-19 DIAGNOSIS — I4891 Unspecified atrial fibrillation: Secondary | ICD-10-CM | POA: Diagnosis present

## 2016-01-19 DIAGNOSIS — I10 Essential (primary) hypertension: Secondary | ICD-10-CM | POA: Diagnosis present

## 2016-01-19 DIAGNOSIS — I2510 Atherosclerotic heart disease of native coronary artery without angina pectoris: Secondary | ICD-10-CM | POA: Diagnosis present

## 2016-01-19 LAB — BASIC METABOLIC PANEL
Anion gap: 7 (ref 5–15)
BUN: 12 mg/dL (ref 6–20)
CO2: 25 mmol/L (ref 22–32)
CREATININE: 0.76 mg/dL (ref 0.44–1.00)
Calcium: 8.9 mg/dL (ref 8.9–10.3)
Chloride: 103 mmol/L (ref 101–111)
GFR calc Af Amer: >60 mL/min (ref >60)
GLUCOSE: 105 mg/dL — AB (ref 65–99)
POTASSIUM: 3.5 mmol/L (ref 3.5–5.1)
Sodium: 135 mmol/L (ref 135–145)

## 2016-01-19 LAB — ECHOCARDIOGRAM COMPLETE
HEIGHTINCHES: 69 in
Weight: 2340.79 oz

## 2016-01-19 LAB — TROPONIN I
TROPONIN I: 0.06 ng/mL — AB (ref <0.03)
TROPONIN I: 0.05 ng/mL — AB (ref <0.03)
Troponin I: 0.07 ng/mL (ref <0.03)

## 2016-01-19 LAB — CBC
HEMATOCRIT: 35.6 % (ref 35.0–47.0)
Hemoglobin: 12.5 g/dL (ref 12.0–16.0)
MCH: 31.6 pg (ref 26.0–34.0)
MCHC: 35.2 g/dL (ref 32.0–36.0)
MCV: 89.9 fL (ref 80.0–100.0)
PLATELETS: 123 10*3/uL — AB (ref 150–440)
RBC: 3.96 MIL/uL (ref 3.80–5.20)
RDW: 14.8 % — AB (ref 11.5–14.5)
WBC: 5.1 10*3/uL (ref 3.6–11.0)

## 2016-01-19 MED ORDER — ONDANSETRON HCL 4 MG/2ML IJ SOLN: 4.0000 mg | Freq: Four times a day (QID) | INTRAMUSCULAR | Status: DC | PRN

## 2016-01-19 MED ORDER — DORZOLAMIDE HCL 2 % OP SOLN
1.0000 [drp] | Freq: Three times a day (TID) | OPHTHALMIC | Status: DC
  Filled 2016-01-19: qty 10

## 2016-01-19 MED ORDER — MORPHINE BOLUS VIA INFUSION: 5.0000 mg | INTRAVENOUS | Status: DC | PRN

## 2016-01-19 MED ORDER — LATANOPROST 0.005 % OP SOLN
1.0000 [drp] | Freq: Every day | OPHTHALMIC | Status: DC
  Filled 2016-01-19: qty 2.5

## 2016-01-19 MED ORDER — LORAZEPAM BOLUS VIA INFUSION
2.0000 mg | INTRAVENOUS | Status: DC | PRN
  Filled 2016-01-19: qty 5

## 2016-01-19 MED ORDER — ACETAMINOPHEN 325 MG PO TABS: 650.0000 mg | ORAL_TABLET | Freq: Four times a day (QID) | ORAL | Status: DC | PRN

## 2016-01-19 MED ORDER — LORAZEPAM 2 MG/ML IJ SOLN
5.0000 mg/h | INTRAVENOUS | Status: DC
  Filled 2016-01-19: qty 25

## 2016-01-19 MED ORDER — FUROSEMIDE 20 MG PO TABS
20.0000 mg | ORAL_TABLET | Freq: Every day | ORAL | Status: DC
  Filled 2016-01-19: qty 1

## 2016-01-19 MED ORDER — LORAZEPAM 0.5 MG PO TABS: 0.5000 mg | ORAL_TABLET | ORAL | 0 refills | Status: AC | PRN

## 2016-01-19 MED ORDER — SODIUM CHLORIDE 0.9% FLUSH
3.0000 mL | Freq: Two times a day (BID) | INTRAVENOUS | Status: DC
  Administered 2016-01-19: 3 mL via INTRAVENOUS

## 2016-01-19 MED ORDER — ENOXAPARIN SODIUM 40 MG/0.4ML ~~LOC~~ SOLN: 40.0000 mg | SUBCUTANEOUS | Status: DC

## 2016-01-19 MED ORDER — MORPHINE 100MG IN NS 100ML (1MG/ML) PREMIX INFUSION
10.0000 mg/h | INTRAVENOUS | Status: DC
  Filled 2016-01-19: qty 100

## 2016-01-19 MED ORDER — DILTIAZEM HCL ER COATED BEADS 120 MG PO CP24
120.0000 mg | ORAL_CAPSULE | Freq: Every day | ORAL | Status: DC
  Filled 2016-01-19: qty 1

## 2016-01-19 MED ORDER — PANTOPRAZOLE SODIUM 40 MG PO TBEC
40.0000 mg | DELAYED_RELEASE_TABLET | Freq: Every day | ORAL | Status: DC
  Filled 2016-01-19: qty 1

## 2016-01-19 MED ORDER — ASPIRIN EC 81 MG PO TBEC
81.0000 mg | DELAYED_RELEASE_TABLET | Freq: Every day | ORAL | Status: DC
  Filled 2016-01-19: qty 1

## 2016-01-19 MED ORDER — MORPHINE SULFATE (PF) 2 MG/ML IV SOLN: 2.0000 mg | INTRAVENOUS | 0 refills | Status: AC | PRN

## 2016-01-19 MED ORDER — ONDANSETRON HCL 4 MG PO TABS: 4.0000 mg | ORAL_TABLET | Freq: Four times a day (QID) | ORAL | Status: DC | PRN

## 2016-01-19 MED ORDER — MORPHINE SULFATE (PF) 2 MG/ML IV SOLN
INTRAVENOUS | Status: AC
  Filled 2016-01-19: qty 1

## 2016-01-19 MED ORDER — MORPHINE SULFATE (PF) 2 MG/ML IV SOLN
2.0000 mg | INTRAVENOUS | Status: DC | PRN
  Administered 2016-01-19: 2 mg via INTRAVENOUS

## 2016-01-19 MED ORDER — ACETAMINOPHEN 650 MG RE SUPP: 650.0000 mg | Freq: Four times a day (QID) | RECTAL | Status: DC | PRN

## 2016-01-19 MED ORDER — OXYCODONE-ACETAMINOPHEN 5-325 MG PO TABS: 1.0000 | ORAL_TABLET | ORAL | Status: DC | PRN

## 2016-01-19 MED ORDER — MORPHINE SULFATE 25 MG/ML IV SOLN: 10.0000 mg/h | INTRAVENOUS | Status: DC

## 2016-01-19 MED ORDER — ATORVASTATIN CALCIUM 20 MG PO TABS: 40.0000 mg | ORAL_TABLET | Freq: Every evening | ORAL | Status: DC

## 2016-01-19 NOTE — Progress Notes (Signed)
Pt is A&O, no c/o chest pain on shift, walks to the bathroom with one assist. Pt continues to get SOB with exertion/accessory muscle use, pt refuses oxygen for comfort, O2 sats in the 90-95 %. HR dropped to the 30's couple of times over night but did not sustained. MD Marcille Blanco made aware of second Troponin level of .07 no new orders received. Will continue to monitor.

## 2016-01-19 NOTE — Clinical Social Work Note (Signed)
Clinical Social Work Assessment  Patient Details  Name: Samantha Kerr MRN: [id number] Date of Birth: [birth year]  Date of referral:  01/19/16               Reason for consult:  Discharge Planning                Permission sought to share information with:  Family Supports Permission granted to share information::     Name::        Agency::     Relationship::   Inez Catalina Clemson- Daughter-in-law)  Sport and exercise psychologist Information:     Housing/Transportation Living arrangements for the past 2 months:  Single Family Home Source of Information:  Patient, Adult Children Patient Interpreter Needed:  None Criminal Activity/Legal Involvement Pertinent to Current Situation/Hospitalization:  No - Comment as needed Significant Relationships:  Adult Children Lives with:  Self Do you feel safe going back to the place where you live?  No Need for family participation in patient care:  Yes (Comment) Inez Catalina - Daughter-in-law)  Care giving concerns:  Patient and her family are agreeable to Residential Hospice Placement.    Social Worker assessment / plan:  CSW received consult for Residential Hospice Placement. CSW met with patient's daughter-in-law and confirmed that she wanted [facility]. CSW made referral to [place] Liaison. CSW completed discharge packet and placed in patient's chart for Santiago Glad.   Employment status:  Retired Forensic scientist:  Medicare PT Recommendations:  Not assessed at this time [place] / Referral to community resources:   Cytogeneticist)  Patient/Family's Response to care:  Patient and family are agreeable to [facility] at discharge.   Patient/Family's Understanding of and Emotional Response to Diagnosis, Current Treatment, and Prognosis:  Reported they understood Diagnosis, Current Treatment, and Prognosis. Thanked CSW for assistance.   Emotional Assessment Appearance:  Appears stated  age Attitude/Demeanor/Rapport:   (None) Affect (typically observed):  Calm, Pleasant Orientation:  Oriented to Self, Oriented to Place, Oriented to  Time, Oriented to Situation Alcohol / Substance use:  Not Applicable Psych involvement (Current and /or in the community):  No (Comment)  Discharge Needs  Concerns to be addressed:  Discharge Planning Concerns Readmission within the last 30 days:  No Current discharge risk:  Chronically ill Barriers to Discharge:  Continued Medical Work up   [employer], LCSW 01/19/2016, 2:16 PM

## 2016-01-19 NOTE — Care Management Obs Status (Signed)
[place] NOTIFICATION   Patient Details  Name: Samantha Kerr MRN: [id number] Date of Birth: Sep 29, 1920   Medicare Observation Status Notification Given:  Yes    Jolly Mango, RN 01/19/2016, 11:47 AM

## 2016-01-19 NOTE — Progress Notes (Signed)
[place] at [place] NAME: Samantha Kerr    MR#:  [id number]  DATE OF BIRTH:  May 16, 1920  SUBJECTIVE:  CHIEF COMPLAINT:   Chief Complaint  Patient presents with  . Chest Pain  Seems very short of breath just while sitting in the bed, she also feels very tired and not wanting to talk.  REVIEW OF SYSTEMS:  Review of Systems  Constitutional: Positive for malaise/fatigue. Negative for chills, fever and weight loss.  HENT: Negative for nosebleeds and sore throat.   Eyes: Negative for blurred vision.  Respiratory: Positive for shortness of breath. Negative for cough and wheezing.   Cardiovascular: Positive for chest pain and palpitations. Negative for orthopnea, leg swelling and PND.  Gastrointestinal: Negative for abdominal pain, constipation, diarrhea, heartburn, nausea and vomiting.  Genitourinary: Negative for dysuria and urgency.  Musculoskeletal: Negative for back pain.  Skin: Negative for rash.  Neurological: Positive for weakness. Negative for dizziness, speech change, focal weakness and headaches.  Endo/Heme/Allergies: Does not bruise/bleed easily.  Psychiatric/Behavioral: Negative for depression.    DRUG ALLERGIES:   Allergies  Allergen Reactions  . Codeine Other (See Comments)    Unknown reaction   . Sulfa Antibiotics Other (See Comments)    unknown   VITALS:  Blood pressure 132/82, pulse 68, temperature 98.5 F (36.9 C), temperature source Oral, resp. rate 18, height 5\' 9"  (1.753 m), weight 66.4 kg (146 lb 4.8 oz), SpO2 94 %. PHYSICAL EXAMINATION:  Physical Exam  Constitutional: She is oriented to person, place, and time and well-developed, well-nourished, and in no distress.  HENT:  Head: Normocephalic and atraumatic.  Eyes: Conjunctivae and EOM are normal. Pupils are equal, round, and reactive to light.  Neck: Normal range of motion. Neck supple. No tracheal deviation present. No thyromegaly present.  Cardiovascular:  Normal rate and regular rhythm.   Murmur heard.  Systolic murmur is present  Pulmonary/Chest: Effort normal and breath sounds normal. No respiratory distress. She has no wheezes. She exhibits no tenderness.  Abdominal: Soft. Bowel sounds are normal. She exhibits no distension. There is no tenderness.  Musculoskeletal: Normal range of motion.  Neurological: She is alert and oriented to person, place, and time. No cranial nerve deficit.  Skin: Skin is warm and dry. No rash noted.  Psychiatric: Mood and affect normal.   LABORATORY PANEL:   CBC  Recent Labs Lab 01/19/16 0708  WBC 5.1  HGB 12.5  HCT 35.6  PLT 123*   ------------------------------------------------------------------------------------------------------------------ Chemistries   Recent Labs Lab 01/18/16 2046 01/19/16 0708  NA 134* 135  K 3.5 3.5  CL 101 103  CO2 28 25  GLUCOSE 118* 105*  BUN 12 12  CREATININE 0.83 0.76  CALCIUM 9.0 8.9  MG 1.9  --   AST 23  --   ALT 16  --   ALKPHOS 72  --   BILITOT 0.8  --    RADIOLOGY:  Dg Chest 2 View  Result Date: 01/18/2016 CLINICAL DATA:  Chest pain for several hours EXAM: CHEST  2 VIEW COMPARISON:  05/12/2015 FINDINGS: Cardiac shadow remains enlarged. Increased central vascular congestion is noted. No significant edema is noted. No focal confluent infiltrate is seen. There is some vague increased density identified in right perihilar region which was present on previous exams dating back to 2015 consistent with prominent central pulmonary vasculature. Left atrial enlargement is noted. Diffuse aortic calcifications are seen. No bony abnormality is noted. IMPRESSION: Mild central vascular congestion consistent with  early CHF. Electronically Signed   By: Inez Catalina M.D.   On: 01/18/2016 21:12   ASSESSMENT AND PLAN:  82 y.o. female who presents with An episode of palpitations associated with some chest discomfort, palpitations   * Chest pain - patient had some chest  discomfort and shortness of breath associated with her episode of palpitations.  Await echocardiogram and Appreciate cardiology input.  * Acute on chronic diastolic CHF: - Likely due to severe aortic stenosis, in the past (2015) She was referred to [facility] for TAVR, but she refused - Likely progressing  * A-fib: - Rate is well controlled on Cardizem  * Severe Aortic stenosis; Pt not interested in TAVR - Consider hospice, we will consult palliative care  * CAD (coronary artery disease)  - Continue aspirin, Lipitor, Cardizem and Lasix  * HTN (hypertension) - Controlled on Lasix, Cardizem  * GERD (gastroesophageal reflux disease)  - Continue Protonix      All the records are reviewed and case discussed with Care Management/Social Worker. Management plans discussed with the patient, family and they are in agreement.  CODE STATUS: DO NOT RESUSCITATE, consider comfort care and hospice.  Discussed with palliative care nurse practitioner, Stanton Kidney, who will have further discussion with patient and family.  TOTAL TIME TAKING CARE OF THIS PATIENT: 35 minutes.   More than 50% of the time was spent in counseling/coordination of care: YES  POSSIBLE D/C IN 1-2 DAYS, DEPENDING ON CLINICAL CONDITION.   [facility], Aamir Mclinden M.D on 01/19/2016 at 11:34 AM  Between 7am to 6pm - Pager - [phone]  After 6pm go to [email or web address] - Proofreader  [employer] [place] [facility]  Office  [phone]  CC: Primary care physician; Tracie Harrier, MD  Note: This dictation was prepared with Dragon dictation along with smaller phrase technology. Any transcriptional errors that result from this process are unintentional.

## 2016-01-19 NOTE — Progress Notes (Signed)
EMS arrived, IV and tele removed per protocol.

## 2016-01-19 NOTE — ED Notes (Signed)
Pt IV wrapped with arm board and gauze. Pt was c/o that it was hurting her when she bends her arm. Pt given 2 options: arm board and wrap arm or start new IV in different place and take out old IV. Pt chose to try arm board and gauze.

## 2016-01-19 NOTE — Discharge Summary (Signed)
[facility] [place] NAME: Samantha Kerr    MR#:  [id number]  DATE OF BIRTH:  1920-09-10  DATE OF ADMISSION:  01/18/2016   ADMITTING PHYSICIAN: Lance Coon, MD  DATE OF DISCHARGE: No discharge date for patient encounter.  PRIMARY CARE PHYSICIAN: HANDE,VISHWANATH, MD   ADMISSION DIAGNOSIS:  Palpitations [R00.2] Elevated troponin [R79.89] Chest pain, unspecified chest pain type [R07.9] DISCHARGE DIAGNOSIS:  Principal Problem:   Chest pain Active Problems:   A-fib (HCC)   Chronic diastolic CHF (congestive heart failure) (HCC)   CAD (coronary artery disease)   HTN (hypertension)   GERD (gastroesophageal reflux disease)  SECONDARY DIAGNOSIS:   Past Medical History:  Diagnosis Date  . A-fib ([place])   . Aortic stenosis   . Atrial fibrillation ([place])   . Cancer ([place])   . CHF (congestive heart failure) ([place])   . GERD (gastroesophageal reflux disease)   . High cholesterol   . Hypertension    HOSPITAL COURSE:  82 y.o.femalewho presents with An episode of palpitations associated with some chest discomfort, palpitations   * Chest pain - comfort care. Hospice  * Acute on chronic diastolic CHF: - Likely due to severe aortic stenosis, in the past (2015) She was referred to [facility] for TAVR, but she refused. Likely worsening. - comfort care. Hospice  *A-fib: comfort care. Hospice  * Severe Aortic stenosis; Pt not interested in TAVR. Likely worsening. comfort care. Hospice  * CAD (coronary artery disease)  comfort care. Hospice  *HTN (hypertension) comfort care. Hospice  *GERD (gastroesophageal reflux disease)  comfort care. Hospice  DISCHARGE CONDITIONS:  Serious CONSULTS OBTAINED:  Treatment Team:  Corey Skains, MD DRUG ALLERGIES:   Allergies  Allergen Reactions  . Codeine Other (See Comments)    Unknown reaction   . Sulfa Antibiotics Other (See Comments)    unknown   DISCHARGE MEDICATIONS:       Medication List    STOP taking these medications   aspirin EC 81 MG tablet   atorvastatin 40 MG tablet Commonly known as:  LIPITOR   bimatoprost 0.03 % ophthalmic solution Commonly known as:  LUMIGAN   CALCIUM 600 1500 (600 Ca) MG Tabs tablet Generic drug:  calcium carbonate   CARTIA XT 120 MG 24 hr capsule Generic drug:  diltiazem   docusate sodium 100 MG capsule Commonly known as:  COLACE   dorzolamide 2 % ophthalmic solution Commonly known as:  TRUSOPT   fluticasone 50 MCG/ACT nasal spray Commonly known as:  FLONASE   furosemide 20 MG tablet Commonly known as:  LASIX   Iron 325 (65 Fe) MG Tabs   omeprazole 20 MG capsule Commonly known as:  PRILOSEC     TAKE these medications   LORazepam 0.5 MG tablet Commonly known as:  ATIVAN Take 1 tablet (0.5 mg total) by mouth every 4 (four) hours as needed for anxiety.   morphine 2 MG/ML injection Inject 1 mL (2 mg total) into the vein every 2 (two) hours as needed.      DISCHARGE INSTRUCTIONS:   DIET:  Regular diet DISCHARGE CONDITION:  Serious ACTIVITY:  Activity as tolerated OXYGEN:  Home Oxygen: No.  Oxygen Delivery: room air DISCHARGE LOCATION:  Hospice home   If you experience worsening of your admission symptoms, develop shortness of breath, life threatening emergency, suicidal or homicidal thoughts you must seek medical attention immediately by calling 911 or calling your MD immediately  if symptoms less severe.  You Must  read complete instructions/literature along with all the possible adverse reactions/side effects for all the Medicines you take and that have been prescribed to you. Take any new Medicines after you have completely understood and accpet all the possible adverse reactions/side effects.   Please note  You were cared for by a hospitalist during your hospital stay. If you have any questions about your discharge medications or the care you received while you were in the hospital after you  are discharged, you can call the unit and asked to speak with the hospitalist on call if the hospitalist that took care of you is not available. Once you are discharged, your primary care physician will handle any further medical issues. Please note that NO REFILLS for any discharge medications will be authorized once you are discharged, as it is imperative that you return to your primary care physician (or establish a relationship with a primary care physician if you do not have one) for your aftercare needs so that they can reassess your need for medications and monitor your lab values.    On the day of Discharge:  VITAL SIGNS:  Blood pressure 103/83, pulse 66, temperature 98 F (36.7 C), temperature source Oral, resp. rate 17, height 5\' 9"  (1.753 m), weight 66.4 kg (146 lb 4.8 oz), SpO2 95 %. PHYSICAL EXAMINATION:  GENERAL:  82 y.o.-year-old patient lying in the bed. Seem to be in acute distress.  Constitutional: She is oriented to person, place, and time and cachetic HENT:  Head: Normocephalic and atraumatic.  Eyes: Conjunctivae and EOM are normal. Pupils are equal, round, and reactive to light.  Neck: Normal range of motion. Neck supple. No tracheal deviation present. No thyromegaly present.  Cardiovascular: Normal rate and regular rhythm.   Murmur heard.  Systolic murmur is present  Pulmonary/Chest: She is using accessory muscles of respirations, tachypneic, gasping for air at times  Abdominal: Soft. Bowel sounds are normal. She exhibits no distension. There is no tenderness.  Musculoskeletal: Normal range of motion.  Neurological: She is alert and oriented to person, place, and time. No cranial nerve deficit.  Skin: Skin is warm and dry. No rash noted.  Psychiatric: Mood and affect normal.  DATA REVIEW:   CBC  Recent Labs Lab 01/19/16 0708  WBC 5.1  HGB 12.5  HCT 35.6  PLT 123*    Chemistries   Recent Labs Lab 01/18/16 2046 01/19/16 0708  NA 134* 135  K 3.5 3.5  CL  101 103  CO2 28 25  GLUCOSE 118* 105*  BUN 12 12  CREATININE 0.83 0.76  CALCIUM 9.0 8.9  MG 1.9  --   AST 23  --   ALT 16  --   ALKPHOS 72  --   BILITOT 0.8  --     Management plans discussed with the patient, family and they are in agreement.  CODE STATUS DO NOT RESUSCITATE, Comfort Care/hospice  TOTAL TIME TAKING CARE OF THIS PATIENT: 45 minutes.    [facility], Krishawn Vanderweele M.D on 01/19/2016 at 2:53 PM   Between 7am to 6pm - Pager - [phone]  After 6pm go to [email or web address] - Proofreader  [facility]  Office  [phone]  CC: Primary care physician; Tracie Harrier, MD   Note: This dictation was prepared with Dragon dictation along with smaller phrase technology. Any transcriptional errors that result from this process are unintentional.

## 2016-01-19 NOTE — Consult Note (Signed)
[facility] Cardiology Consultation Note  Patient ID: Samantha Kerr, MRN: [id number], DOB/AGE: 1920/12/21 82 y.o. Admit date: 01/18/2016   Date of Consult: 01/19/2016 Primary Physician: Tracie Harrier, MD Primary Cardiologist: Paraschos  Chief Complaint:  Chief Complaint  Patient presents with  . Chest Pain   Reason for Consult: chest pain and shortness of breath  HPI: 82 y.o. female with known apparent paroxysmal nonvalvular atrial fibrillation mild aortic valve stenosis with essential hypertension mixed hyperlipidemia and chronic diastolic dysfunction heart failure coming in with the significant chest discomfort left chest discomfort tender to the touch and wincing when you push on her left chest. She does have known coronary artery disease status post previous stenting of which she now has pain EKG showing normal sinus rhythm with frequent pre-atrial contractions left axis deviation and poor R-wave progression but no evidence of myocardial infarction at this time. Troponins are 0.07 consistent with demand ischemia rather than unstable angina or acute coronary syndrome. At this time the patient is comfortable without oxygen and has had chest wall tenderness more than any other significant symptoms.  Past Medical History:  Diagnosis Date  . A-fib ([place])   . Aortic stenosis   . Atrial fibrillation ([place])   . Cancer ([place])   . CHF (congestive heart failure) ([place])   . GERD (gastroesophageal reflux disease)   . High cholesterol   . Hypertension       Surgical History:  Past Surgical History:  Procedure Laterality Date  . ABDOMINAL HYSTERECTOMY    . BREAST SURGERY    . CARDIAC CATHETERIZATION       Home Meds: Prior to Admission medications   Medication Sig Start Date End Date Taking? Authorizing Provider  aspirin EC 81 MG tablet Take 81 mg by mouth daily.   Yes Historical Provider, MD  atorvastatin (LIPITOR) 40 MG tablet Take 40 mg by mouth every evening.   Yes Historical Provider,  MD  bimatoprost (LUMIGAN) 0.03 % ophthalmic solution Place 1 drop into both eyes at bedtime.   Yes Historical Provider, MD  calcium carbonate (CALCIUM 600) 1500 (600 Ca) MG TABS tablet Take 600 mg of elemental calcium by mouth daily with breakfast.   Yes Historical Provider, MD  CARTIA XT 120 MG 24 hr capsule Take 120 mg by mouth daily.   Yes Historical Provider, MD  docusate sodium (COLACE) 100 MG capsule Take 100 mg by mouth 3 (three) times daily.   Yes Historical Provider, MD  dorzolamide (TRUSOPT) 2 % ophthalmic solution Place 1 drop into both eyes 3 (three) times daily.   Yes Historical Provider, MD  Ferrous Sulfate (IRON) 325 (65 Fe) MG TABS Take 1 tablet by mouth daily.   Yes Historical Provider, MD  fluticasone (FLONASE) 50 MCG/ACT nasal spray Place 1 spray into both nostrils daily.   Yes Historical Provider, MD  furosemide (LASIX) 20 MG tablet Take 20 mg by mouth daily.   Yes Historical Provider, MD  omeprazole (PRILOSEC) 20 MG capsule Take 20 mg by mouth daily.   Yes Historical Provider, MD    Inpatient Medications:  . aspirin EC  81 mg Oral Daily  . atorvastatin  40 mg Oral QPM  . diltiazem  120 mg Oral Daily  . dorzolamide  1 drop Both Eyes TID  . enoxaparin (LOVENOX) injection  40 mg Subcutaneous Q24H  . furosemide  20 mg Oral Daily  . latanoprost  1 drop Both Eyes QHS  . morphine      . pantoprazole  40  mg Oral Daily  . sodium chloride flush  3 mL Intravenous Q12H      Allergies:  Allergies  Allergen Reactions  . Codeine Other (See Comments)    Unknown reaction   . Sulfa Antibiotics Other (See Comments)    unknown    Social History   Social History  . Marital status: Widowed    Spouse name: N/A  . Number of children: N/A  . Years of education: N/A   Occupational History  . Not on file.   Social History Main Topics  . Smoking status: Former Research scientist (life sciences)  . Smokeless tobacco: Never Used  . Alcohol use No  . Drug use: No  . Sexual activity: No   Other Topics  Concern  . Not on file   Social History Narrative  . No narrative on file     Family History  Problem Relation Age of Onset  . Heart disease Mother   . Stroke Mother   . Breast cancer Sister      Review of Systems Positive for Chest pain shortness of breath Negative for: General:  chills, fever, night sweats or weight changes.  Cardiovascular: PND orthopnea syncope dizziness  Dermatological skin lesions rashes Respiratory: Cough congestion Urologic: Frequent urination urination at night and hematuria Abdominal: negative for nausea, vomiting, diarrhea, bright red blood per rectum, melena, or hematemesis Neurologic: negative for visual changes, and/or hearing changes  All other systems reviewed and are otherwise negative except as noted above.  Labs:  Recent Labs  01/18/16 2046 01/19/16 0239 01/19/16 0708  TROPONINI 0.04* 0.07* 0.06*   Lab Results  Component Value Date   WBC 5.1 01/19/2016   HGB 12.5 01/19/2016   HCT 35.6 01/19/2016   MCV 89.9 01/19/2016   PLT 123 (L) 01/19/2016    Recent Labs Lab 01/18/16 2046 01/19/16 0708  NA 134* 135  K 3.5 3.5  CL 101 103  CO2 28 25  BUN 12 12  CREATININE 0.83 0.76  CALCIUM 9.0 8.9  PROT 7.1  --   BILITOT 0.8  --   ALKPHOS 72  --   ALT 16  --   AST 23  --   GLUCOSE 118* 105*   Lab Results  Component Value Date   CHOL 110 11/18/2013   HDL 61 (H) 11/18/2013   LDLCALC 37 11/18/2013   TRIG 58 11/18/2013   No results found for: DDIMER  Radiology/Studies:  Dg Chest 2 View  Result Date: 01/18/2016 CLINICAL DATA:  Chest pain for several hours EXAM: CHEST  2 VIEW COMPARISON:  05/12/2015 FINDINGS: Cardiac shadow remains enlarged. Increased central vascular congestion is noted. No significant edema is noted. No focal confluent infiltrate is seen. There is some vague increased density identified in right perihilar region which was present on previous exams dating back to 2015 consistent with prominent central pulmonary  vasculature. Left atrial enlargement is noted. Diffuse aortic calcifications are seen. No bony abnormality is noted. IMPRESSION: Mild central vascular congestion consistent with early CHF. Electronically Signed   By: Inez Catalina M.D.   On: 01/18/2016 21:12    EKG: Normal sinus rhythm with pre-atrial contractions and left axis deviation with poor R-wave progression  Weights: [employer]   01/18/16 2037 01/19/16 0123 01/19/16 0356  Weight: 155 lb (70.3 kg) 146 lb 4.8 oz (66.4 kg) 146 lb 4.8 oz (66.4 kg)     Physical Exam: Blood pressure 101/70, pulse 80, temperature 97.6 F (36.4 C), temperature source Oral, resp. rate 18, height 5'  9" (1.753 m), weight 146 lb 4.8 oz (66.4 kg), SpO2 95 %. Body mass index is 21.6 kg/m. General: Well developed, well nourished, in no acute distress. Head eyes ears nose throat: Normocephalic, atraumatic, sclera non-icteric, no xanthomas, nares are without discharge. No apparent thyromegaly and/or mass  Lungs: Normal respiratory effort.  no wheezes, no rales, no rhonchi.  Heart: RRR with normal S1 Soft S2. 3+ aortic murmur gallop, no rub, PMI is normal size and placement, carotid upstroke normal without bruit, jugular venous pressure is normal Abdomen: Soft, non-tender, non-distended with normoactive bowel sounds. No hepatomegaly. No rebound/guarding. No obvious abdominal masses. Abdominal aorta is normal size without bruit Extremities: No edema. no cyanosis, no clubbing, no ulcers  Peripheral : 2+ bilateral upper extremity pulses, 2+ bilateral femoral pulses, 2+ bilateral dorsal pedal pulse Neuro: Alert and oriented. No facial asymmetry. No focal deficit. Moves all extremities spontaneously. Musculoskeletal: Normal muscle tone without kyphosis Psych:  Responds to questions appropriately with a normal affect.    Assessment: 82 year old female with known coronary artery disease status post previous stenting to paroxysmal nonvalvular atrial fibrillation  chronic diastolic dysfunction heart failure with aortic valve stenosis having chest wall tenderness and shortness of breath without evidence of an acute coronary syndrome at this time  Plan: 1. Continue of medication management including aspirin for coronary artery disease and chest discomfort 2. Diltiazem for heart rate and blood pressure control 3. Furosemide for chronic diastolic dysfunction heart failure currently stable 4. No further cardiac intervention at this time due to no evidence of acute coronary syndrome based on symptoms listed above 5. Ambulate and follow for any further significant symptoms and consideration of isosorbide if necessary to assess for true angina of. 6. High intensity cholesterol therapy with atorvastatin  Signed, Corey Skains M.D. [facility] 01/19/2016, 8:48 AM

## 2016-01-19 NOTE — H&P (Signed)
[facility] [place] NAME: Samantha Kerr    MR#:  [id number]  DATE OF BIRTH:  Sep 29, 1920  DATE OF ADMISSION:  01/18/2016  PRIMARY CARE PHYSICIAN: Tracie Harrier, MD   REQUESTING/REFERRING PHYSICIAN: Quentin Cornwall, MD  CHIEF COMPLAINT:   Chief Complaint  Patient presents with  . Chest Pain    HISTORY OF PRESENT ILLNESS:  Samantha Kerr  is a 82 y.o. female who presents with An episode of palpitations associated with some chest discomfort. Patient has a known history of A. Fib, but states that she is unsure of her episode of palpitations today is similar to any she may have had in the past with her A. fib. On arrival to the ED she was found to have a mildly elevated troponin. Hospitals were called for admission and further evaluation. Of note patient has a significant history of coronary artery disease with prior stent placement.  PAST MEDICAL HISTORY:   Past Medical History:  Diagnosis Date  . A-fib ([place])   . Aortic stenosis   . Atrial fibrillation ([place])   . Cancer ([place])   . CHF (congestive heart failure) ([place])   . GERD (gastroesophageal reflux disease)   . High cholesterol   . Hypertension     PAST SURGICAL HISTORY:   Past Surgical History:  Procedure Laterality Date  . ABDOMINAL HYSTERECTOMY    . BREAST SURGERY    . CARDIAC CATHETERIZATION      SOCIAL HISTORY:   Social History  Substance Use Topics  . Smoking status: Former Research scientist (life sciences)  . Smokeless tobacco: Never Used  . Alcohol use No    FAMILY HISTORY:   Family History  Problem Relation Age of Onset  . Heart disease Mother   . Stroke Mother   . Breast cancer Sister     DRUG ALLERGIES:   Allergies  Allergen Reactions  . Codeine Other (See Comments)    Unknown reaction   . Sulfa Antibiotics Other (See Comments)    unknown    MEDICATIONS AT HOME:   Prior to Admission medications   Medication Sig Start Date End Date Taking? Authorizing Provider  aspirin EC 81  MG tablet Take 81 mg by mouth daily.   Yes Historical Provider, MD  atorvastatin (LIPITOR) 40 MG tablet Take 40 mg by mouth every evening.   Yes Historical Provider, MD  bimatoprost (LUMIGAN) 0.03 % ophthalmic solution Place 1 drop into both eyes at bedtime.   Yes Historical Provider, MD  calcium carbonate (CALCIUM 600) 1500 (600 Ca) MG TABS tablet Take 600 mg of elemental calcium by mouth daily with breakfast.   Yes Historical Provider, MD  CARTIA XT 120 MG 24 hr capsule Take 120 mg by mouth daily.   Yes Historical Provider, MD  docusate sodium (COLACE) 100 MG capsule Take 100 mg by mouth 3 (three) times daily.   Yes Historical Provider, MD  dorzolamide (TRUSOPT) 2 % ophthalmic solution Place 1 drop into both eyes 3 (three) times daily.   Yes Historical Provider, MD  Ferrous Sulfate (IRON) 325 (65 Fe) MG TABS Take 1 tablet by mouth daily.   Yes Historical Provider, MD  fluticasone (FLONASE) 50 MCG/ACT nasal spray Place 1 spray into both nostrils daily.   Yes Historical Provider, MD  furosemide (LASIX) 20 MG tablet Take 20 mg by mouth daily.   Yes Historical Provider, MD  omeprazole (PRILOSEC) 20 MG capsule Take 20 mg by mouth daily.   Yes Historical Provider, MD  REVIEW OF SYSTEMS:  Review of Systems  Constitutional: Negative for chills, fever, malaise/fatigue and weight loss.  HENT: Negative for ear pain, hearing loss and tinnitus.   Eyes: Negative for blurred vision, double vision, pain and redness.  Respiratory: Positive for shortness of breath. Negative for cough and hemoptysis.   Cardiovascular: Positive for chest pain and palpitations. Negative for orthopnea and leg swelling.  Gastrointestinal: Negative for abdominal pain, constipation, diarrhea, nausea and vomiting.  Genitourinary: Negative for dysuria, frequency and hematuria.  Musculoskeletal: Negative for back pain, joint pain and neck pain.  Skin:       No acne, rash, or lesions  Neurological: Negative for dizziness, tremors,  focal weakness and weakness.  Endo/Heme/Allergies: Negative for polydipsia. Does not bruise/bleed easily.  Psychiatric/Behavioral: Negative for depression. The patient is not nervous/anxious and does not have insomnia.      VITAL SIGNS:   Vitals:   01/18/16 2157 01/18/16 2200 01/18/16 2230 01/18/16 2330  BP: 137/90 (!) 152/80 (!) 145/80 (!) 155/71  Pulse: (!) 105 (!) 46 69 62  Resp: 20 (!) 27 17 20   Temp:      TempSrc:      SpO2: 94% 94% 92% 93%  Weight:      Height:       Wt Readings from Last 3 Encounters:  01/18/16 70.3 kg (155 lb)  05/12/15 71.2 kg (157 lb)    PHYSICAL EXAMINATION:  Physical Exam  Vitals reviewed. Constitutional: She is oriented to person, place, and time. She appears well-developed and well-nourished. No distress.  HENT:  Head: Normocephalic and atraumatic.  Mouth/Throat: Oropharynx is clear and moist.  Eyes: Conjunctivae and EOM are normal. Pupils are equal, round, and reactive to light. No scleral icterus.  Neck: Normal range of motion. Neck supple. No JVD present. No thyromegaly present.  Cardiovascular: Normal rate, regular rhythm and intact distal pulses.  Exam reveals no gallop and no friction rub.   Murmur (3/6 systolic ejection murmur) heard. Respiratory: Effort normal and breath sounds normal. No respiratory distress. She has no wheezes. She has no rales.  GI: Soft. Bowel sounds are normal. She exhibits no distension. There is no tenderness.  Musculoskeletal: Normal range of motion. She exhibits no edema.  No arthritis, no gout  Lymphadenopathy:    She has no cervical adenopathy.  Neurological: She is alert and oriented to person, place, and time. No cranial nerve deficit.  No dysarthria, no aphasia  Skin: Skin is warm and dry. No rash noted. No erythema.  Psychiatric: She has a normal mood and affect. Her behavior is normal. Judgment and thought content normal.    LABORATORY PANEL:   CBC  Recent Labs Lab 01/18/16 2046  WBC 5.7  HGB  13.0  HCT 37.7  PLT 137*   ------------------------------------------------------------------------------------------------------------------  Chemistries   Recent Labs Lab 01/18/16 2046  NA 134*  K 3.5  CL 101  CO2 28  GLUCOSE 118*  BUN 12  CREATININE 0.83  CALCIUM 9.0  MG 1.9  AST 23  ALT 16  ALKPHOS 72  BILITOT 0.8   ------------------------------------------------------------------------------------------------------------------  Cardiac Enzymes  Recent Labs Lab 01/18/16 2046  TROPONINI 0.04*   ------------------------------------------------------------------------------------------------------------------  RADIOLOGY:  Dg Chest 2 View  Result Date: 01/18/2016 CLINICAL DATA:  Chest pain for several hours EXAM: CHEST  2 VIEW COMPARISON:  05/12/2015 FINDINGS: Cardiac shadow remains enlarged. Increased central vascular congestion is noted. No significant edema is noted. No focal confluent infiltrate is seen. There is some vague increased density identified in  right perihilar region which was present on previous exams dating back to 2015 consistent with prominent central pulmonary vasculature. Left atrial enlargement is noted. Diffuse aortic calcifications are seen. No bony abnormality is noted. IMPRESSION: Mild central vascular congestion consistent with early CHF. Electronically Signed   By: Inez Catalina M.D.   On: 01/18/2016 21:12    EKG:   Orders placed or performed during the hospital encounter of 01/18/16  . ED EKG  . ED EKG  . EKG 12-Lead  . EKG 12-Lead    IMPRESSION AND PLAN:  Principal Problem:   Chest pain - patient had some chest discomfort and shortness of breath associated with her episode of palpitations. She had a mildly elevated troponin. We will trend her cardiac enzymes tonight, and get an echocardiogram and cardiology consult in the morning Active Problems:   A-fib (HCC) - EKG shows an abnormal rhythm which might possibly be an atypical A. fib  versus possible ectopic rhythm. Continue home rate controlling medications, echocardiogram and cardiology consult as above   Chronic diastolic CHF (congestive heart failure) ([place]) - not in acute exacerbation, continue home meds   CAD (coronary artery disease) - continue home meds, further workup as above   HTN (hypertension) - continue home meds   GERD (gastroesophageal reflux disease) - home dose PPI  All the records are reviewed and case discussed with ED provider. Management plans discussed with the patient and/or family.  DVT PROPHYLAXIS: SubQ lovenox  GI PROPHYLAXIS: PPI  ADMISSION STATUS: Observation  CODE STATUS: DNR Code Status History    This patient does not have a recorded code status. Please follow your organizational policy for patients in this situation.    Advance Directive Documentation   Flowsheet Row Most Recent Value  Type of Advance Directive  Out of facility DNR (pink MOST or yellow form)  Pre-existing out of facility DNR order (yellow form or pink MOST form)  Yellow form placed in chart (order not valid for inpatient use)  "MOST" Form in Place?  No data      TOTAL TIME TAKING CARE OF THIS PATIENT: 40 minutes.    Khizar Fiorella [place] 01/19/2016, 12:05 AM  [facility]  Office  [phone]  CC: Primary care physician; Tracie Harrier, MD

## 2016-01-19 NOTE — Progress Notes (Signed)
  Echocardiogram 2D Echocardiogram has been performed.  Jennette Dubin 01/19/2016, 10:00 AM

## 2016-01-19 NOTE — Progress Notes (Signed)
   01/19/16 1300  Clinical Encounter Type  Visited With Patient and family together  Visit Type Initial;Spiritual support  Referral From Nurse  Consult/Referral To Chaplain  Stress Factors  Patient Stress Factors Other (Comment)  Family Stress Factors Other (Comment)  Chaplain visited with patient and family. Patient didn't appear to be distressed as we talked. Two sons sat in the room and engaged in conversation. Visit was interrupted by a visiting clergy who came to visit with patient.   [place] [phone]

## 2016-01-19 NOTE — Progress Notes (Addendum)
Family Meeting Note  Advance Directive:yes  Today a meeting took place with the Patient and Family (son).  Patient is unable to participate due [id number] capacity Critically sick   The following clinical team members were present during this meeting:MD, RN, Social worker and Family  The following were discussed:Patient's diagnosis: , Patient's progosis: < 2 weeks and Goals for treatment: DNR, comfort care  Hospice Home placement if beds available.  Time spent during discussion:30 minutes  Samantha Youngers, MD

## 2016-01-19 NOTE — Care Management (Signed)
Case discussed with Dr. Manuella Ghazi. He has had a conversation with patient and family regarding hospice home. RNCM spoke with daughter in law and they prefer [place]. CSW updated.

## 2016-01-19 NOTE — Progress Notes (Signed)
New hospice home referral received from [place]. Mrs. Greenough is a 82 year old woman admitted to [facility] for evaluation of chest pain with palpitations and increased weakness. She has a known history of aortic valve stenosis, CAD, HTN, afib, diastolic CHF and GERD. Per chart note review patient has had increased dyspnea with any exertion,and continues with chest tightness per family report. Echocardiogram performed today showed and EF of 25% with severe pulmonary hypertension. Family met with attending physician Dr. Manuella Ghazi and have chosen not to pursue any aggressive treatment and have chosen to focus on her comfort at the hospice home. Patient seen lying in bed, had just ambulated 6 feet to the bathroom and back, she was very dyspneic and using accessory muscles to breath, she had declined oxygen in the past, but is currently on 2 liters via nasal cannula. She required  Dose of Iv morphine earlier this morning for chest discomfort.  Writer met in the family room with patient son Remo Lipps, daughter in law Inez Catalina and son Rob to initiate education regarding hospice services, philosophy of and team approach to care with good understanding voiced. Questions answered, consents signed. Patient information faxed to hospice referral, report called to the hospice home, [facility] notified for transport. Hospital care team all aware of and in agreement with plan to transfer patient to the hospice home today via EMS. Signed portable DNR in place in discharge packet. Thank you. Flo Shanks RN, BSN, [facility] [place], hospital Liaison [phone] c

## 2016-01-19 NOTE — Progress Notes (Signed)
SATURATION QUALIFICATIONS: (This note is used to comply with regulatory documentation for home oxygen)  Patient Saturations on Room Air at Rest = 92%  Patient Saturations on Room Air while Ambulating = 80%  Patient Saturations on 2 Liters of oxygen while Ambulating = 95%  Please briefly explain why patient needs home oxygen:

## 2016-01-19 NOTE — Progress Notes (Signed)
Pt c/o chest pain " tight pressure on my chest" MD Diamond on floor. Administered Morphin 2 MG as ordered. Will continue to assess.

## 2016-02-12 DEATH — deceased

## 2017-09-12 IMAGING — CR DG CHEST 2V
3 series · 3 of 3 positions shown · non-contrast
Comparison: 05/12/2015

CLINICAL DATA: Chest pain for several hours

EXAM:
CHEST  2 VIEW

[chest pa]
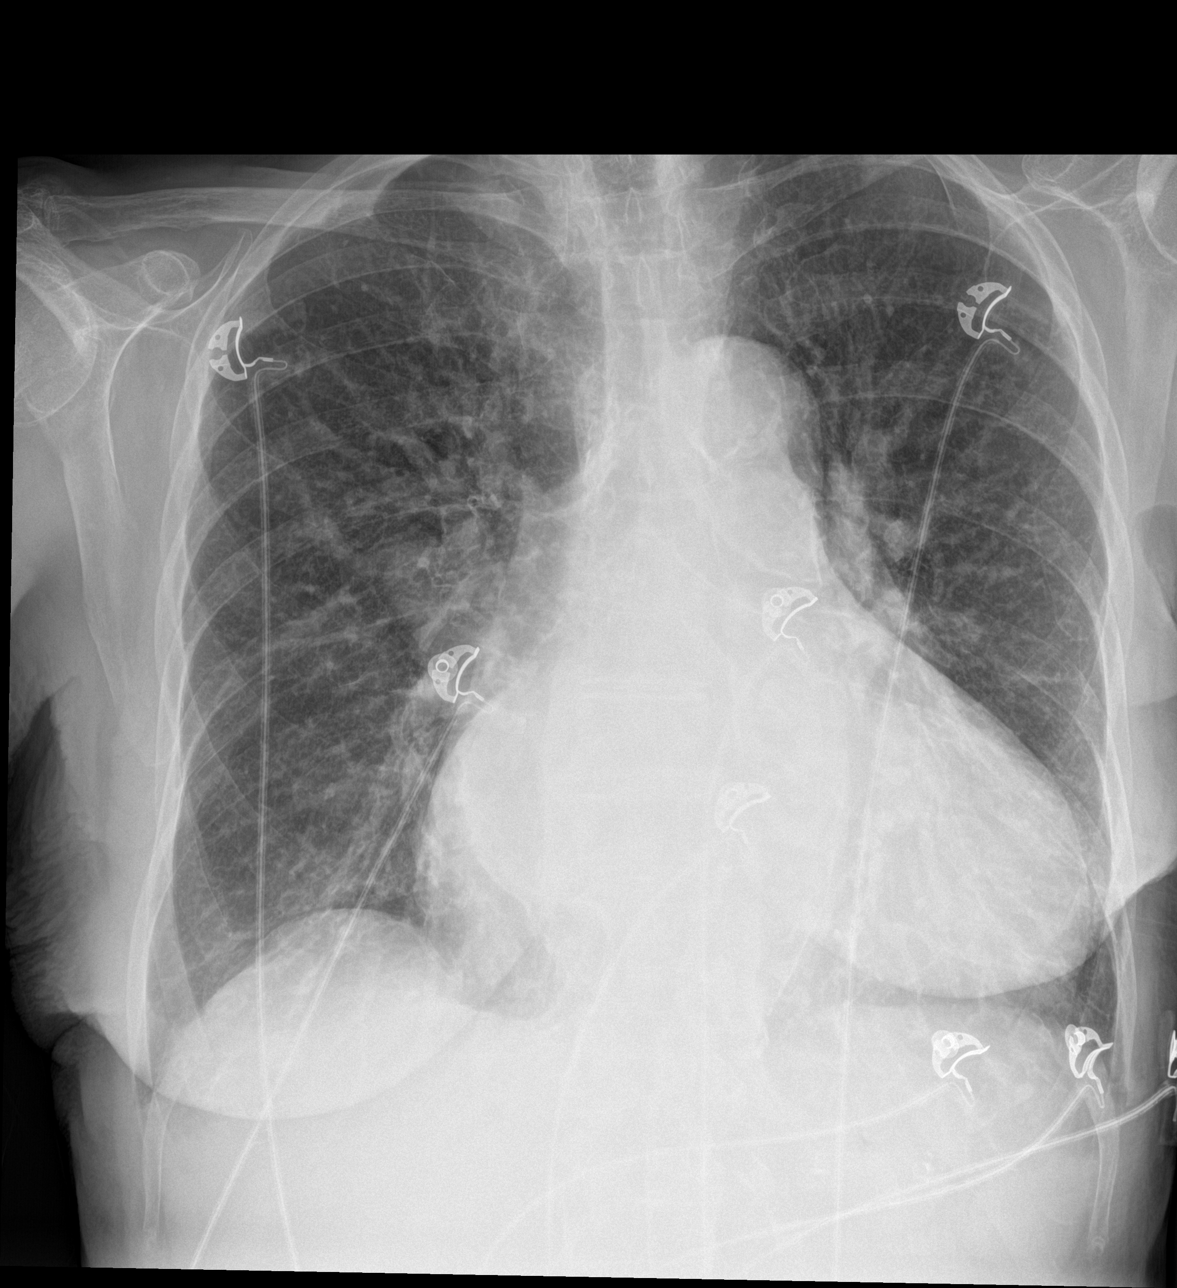

[chest lat]
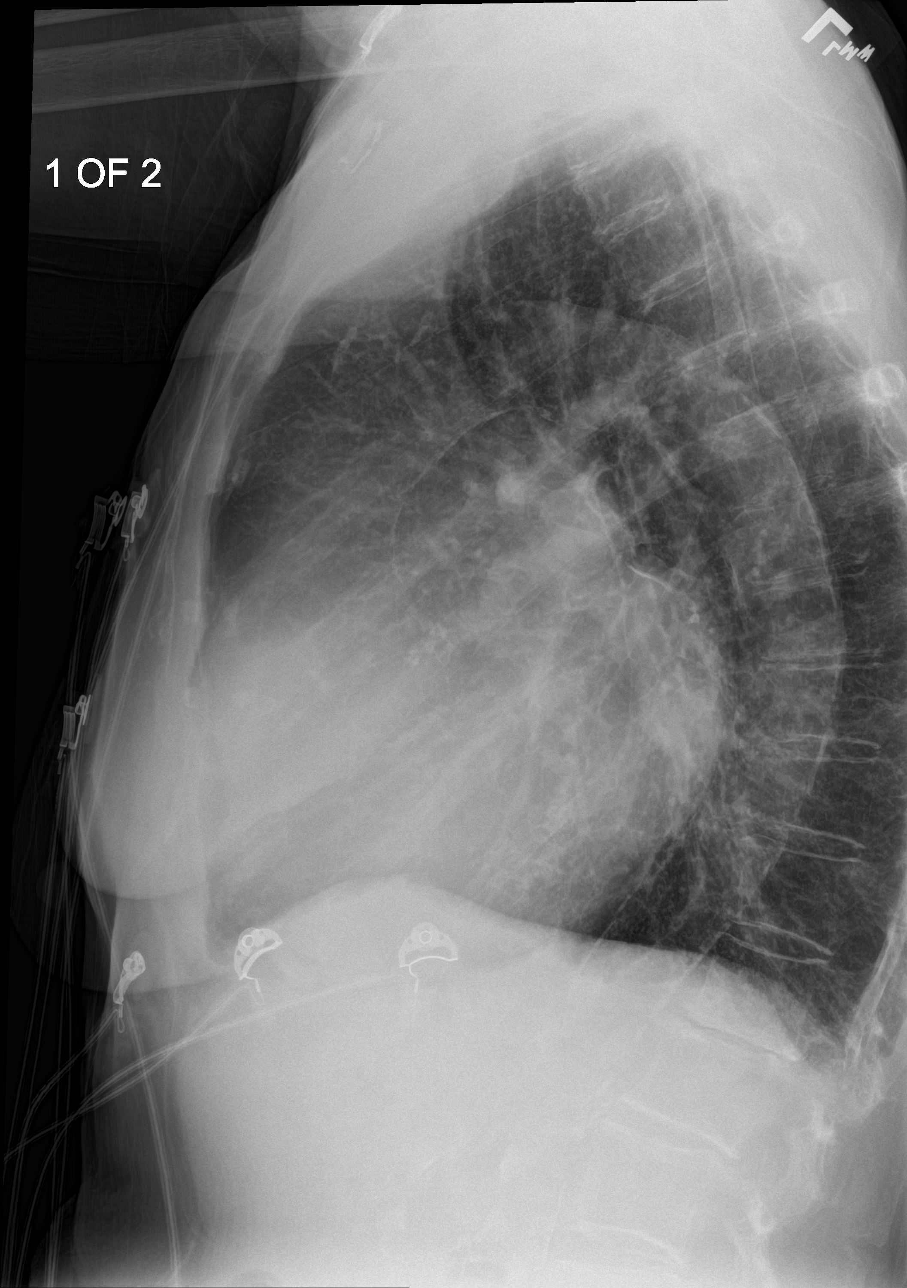

[chest ap]
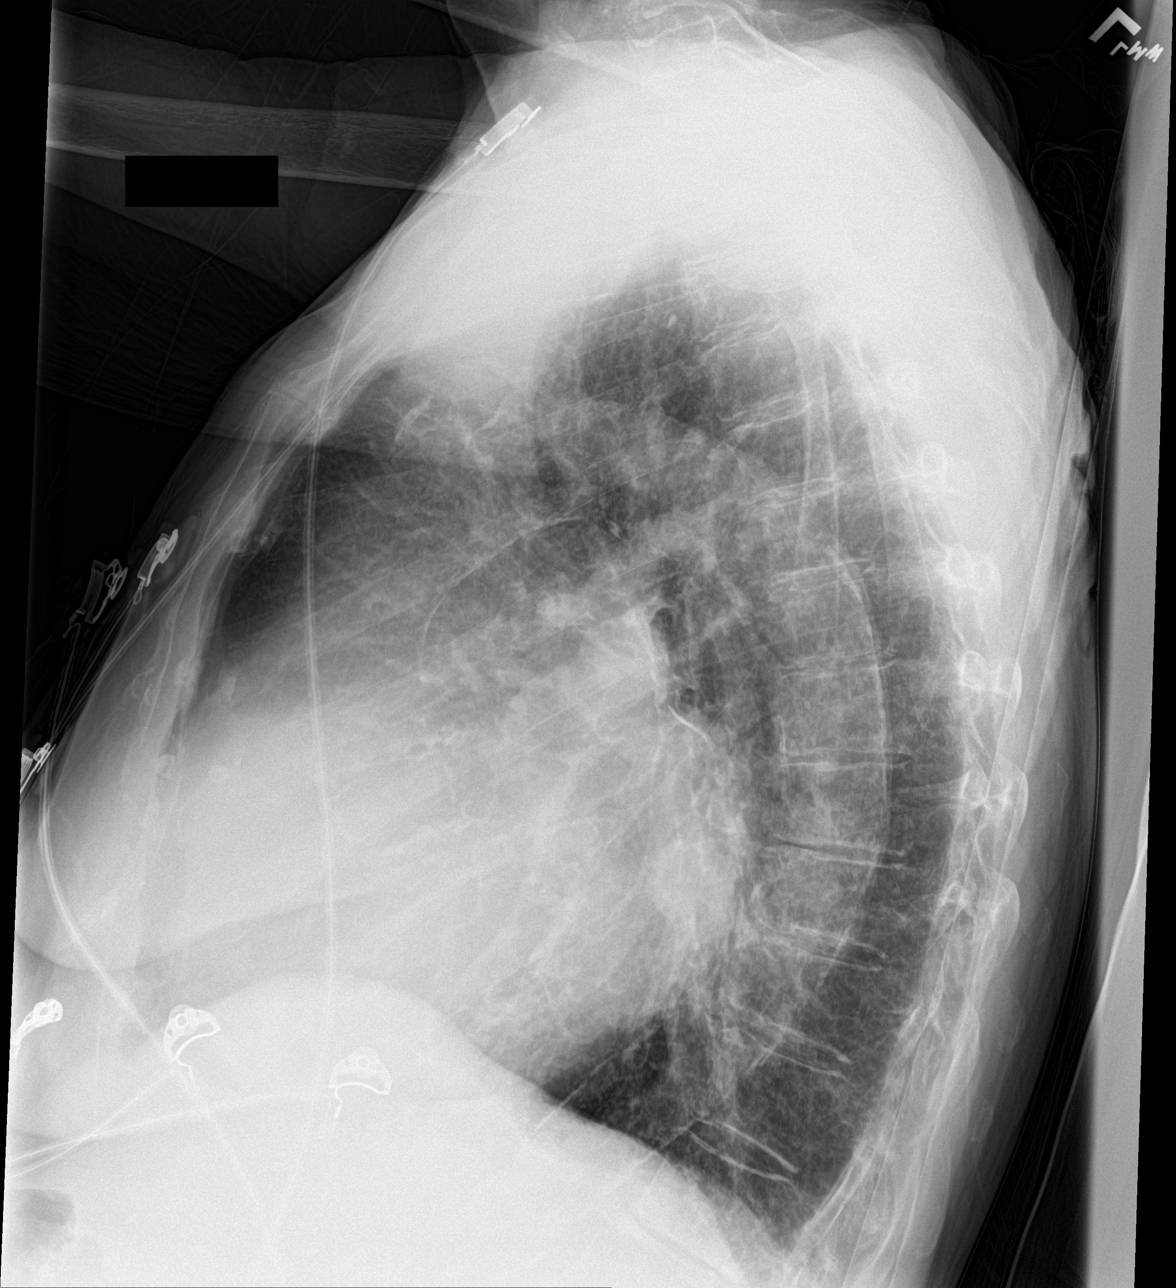

[3 of 3 positions shown; findings below may reference images not displayed]

FINDINGS: Cardiac shadow remains enlarged. Increased central vascular
congestion is noted. No significant edema is noted. No focal
confluent infiltrate is seen. There is some vague increased density
identified in right perihilar region which was present on previous
exams dating back to 3487 consistent with prominent central
pulmonary vasculature. Left atrial enlargement is noted. Diffuse
aortic calcifications are seen. No bony abnormality is noted.
IMPRESSION: Mild central vascular congestion consistent with early CHF.
